# Patient Record
Sex: Female | Born: 1950 | Race: Black or African American | Hispanic: No | Marital: Married | Smoking: Never smoker
Health system: Southern US, Community
[De-identification: ages and names within clinical notes are randomized; demographics above are authoritative.]

## PROBLEM LIST (undated history)

## (undated) DIAGNOSIS — I499 Cardiac arrhythmia, unspecified: Secondary | ICD-10-CM

## (undated) DIAGNOSIS — Z8719 Personal history of other diseases of the digestive system: Secondary | ICD-10-CM

## (undated) DIAGNOSIS — N181 Chronic kidney disease, stage 1: Secondary | ICD-10-CM

## (undated) DIAGNOSIS — M5126 Other intervertebral disc displacement, lumbar region: Secondary | ICD-10-CM

## (undated) DIAGNOSIS — M51369 Other intervertebral disc degeneration, lumbar region without mention of lumbar back pain or lower extremity pain: Secondary | ICD-10-CM

## (undated) DIAGNOSIS — M5136 Other intervertebral disc degeneration, lumbar region: Secondary | ICD-10-CM

## (undated) DIAGNOSIS — G43909 Migraine, unspecified, not intractable, without status migrainosus: Secondary | ICD-10-CM

## (undated) DIAGNOSIS — M353 Polymyalgia rheumatica: Secondary | ICD-10-CM

## (undated) DIAGNOSIS — R112 Nausea with vomiting, unspecified: Secondary | ICD-10-CM

## (undated) DIAGNOSIS — F419 Anxiety disorder, unspecified: Secondary | ICD-10-CM

## (undated) DIAGNOSIS — D649 Anemia, unspecified: Secondary | ICD-10-CM

## (undated) DIAGNOSIS — B029 Zoster without complications: Secondary | ICD-10-CM

## (undated) DIAGNOSIS — M1990 Unspecified osteoarthritis, unspecified site: Secondary | ICD-10-CM

## (undated) DIAGNOSIS — J45909 Unspecified asthma, uncomplicated: Secondary | ICD-10-CM

## (undated) DIAGNOSIS — M179 Osteoarthritis of knee, unspecified: Secondary | ICD-10-CM

## (undated) DIAGNOSIS — K219 Gastro-esophageal reflux disease without esophagitis: Secondary | ICD-10-CM

## (undated) DIAGNOSIS — R011 Cardiac murmur, unspecified: Secondary | ICD-10-CM

## (undated) DIAGNOSIS — Z9889 Other specified postprocedural states: Secondary | ICD-10-CM

## (undated) DIAGNOSIS — E039 Hypothyroidism, unspecified: Secondary | ICD-10-CM

## (undated) DIAGNOSIS — H409 Unspecified glaucoma: Secondary | ICD-10-CM

## (undated) DIAGNOSIS — I10 Essential (primary) hypertension: Secondary | ICD-10-CM

## (undated) DIAGNOSIS — M1710 Unilateral primary osteoarthritis, unspecified knee: Secondary | ICD-10-CM

## (undated) HISTORY — PX: TOTAL ABDOMINAL HYSTERECTOMY: SHX209

## (undated) HISTORY — PX: APPENDECTOMY: SHX54

## (undated) HISTORY — PX: OTHER SURGICAL HISTORY: SHX169

## (undated) HISTORY — PX: TONSILLECTOMY: SUR1361

## (undated) HISTORY — PX: BACK SURGERY: SHX140

## (undated) HISTORY — PX: COLONOSCOPY: SHX174

## (undated) HISTORY — PX: THYROIDECTOMY, PARTIAL: SHX18

## (undated) HISTORY — PX: CHOLECYSTECTOMY: SHX55

## (undated) HISTORY — PX: JOINT REPLACEMENT: SHX530

## (undated) HISTORY — PX: TOTAL KNEE ARTHROPLASTY: SHX125

---

## 2001-06-05 ENCOUNTER — Encounter: Payer: Self-pay | Admitting: Internal Medicine

## 2001-06-05 ENCOUNTER — Encounter: Admission: RE | Admit: 2001-06-05 | Discharge: 2001-06-05 | Payer: Self-pay | Admitting: Internal Medicine

## 2001-06-07 ENCOUNTER — Encounter: Payer: Self-pay | Admitting: Internal Medicine

## 2001-06-07 ENCOUNTER — Encounter: Admission: RE | Admit: 2001-06-07 | Discharge: 2001-06-07 | Payer: Self-pay | Admitting: Internal Medicine

## 2001-06-15 ENCOUNTER — Encounter: Admission: RE | Admit: 2001-06-15 | Discharge: 2001-06-15 | Payer: Self-pay | Admitting: Internal Medicine

## 2001-06-15 ENCOUNTER — Encounter: Payer: Self-pay | Admitting: Internal Medicine

## 2001-09-21 ENCOUNTER — Other Ambulatory Visit: Admission: RE | Admit: 2001-09-21 | Discharge: 2001-09-21 | Payer: Self-pay | Admitting: Internal Medicine

## 2001-10-12 ENCOUNTER — Encounter: Payer: Self-pay | Admitting: Internal Medicine

## 2001-10-12 ENCOUNTER — Encounter: Admission: RE | Admit: 2001-10-12 | Discharge: 2001-10-12 | Payer: Self-pay | Admitting: Internal Medicine

## 2002-08-20 ENCOUNTER — Encounter: Payer: Self-pay | Admitting: Specialist

## 2002-08-25 ENCOUNTER — Ambulatory Visit (HOSPITAL_COMMUNITY): Admission: RE | Admit: 2002-08-25 | Discharge: 2002-08-25 | Payer: Self-pay | Admitting: Specialist

## 2003-07-29 ENCOUNTER — Encounter: Admission: RE | Admit: 2003-07-29 | Discharge: 2003-07-29 | Payer: Self-pay | Admitting: Internal Medicine

## 2004-05-30 ENCOUNTER — Encounter: Admission: RE | Admit: 2004-05-30 | Discharge: 2004-05-30 | Payer: Self-pay | Admitting: Internal Medicine

## 2004-11-14 ENCOUNTER — Emergency Department (HOSPITAL_COMMUNITY): Admission: EM | Admit: 2004-11-14 | Discharge: 2004-11-14 | Payer: Self-pay | Admitting: Emergency Medicine

## 2004-12-02 ENCOUNTER — Emergency Department (HOSPITAL_COMMUNITY): Admission: EM | Admit: 2004-12-02 | Discharge: 2004-12-02 | Payer: Self-pay | Admitting: Emergency Medicine

## 2005-04-19 ENCOUNTER — Ambulatory Visit (HOSPITAL_COMMUNITY): Admission: RE | Admit: 2005-04-19 | Discharge: 2005-04-20 | Payer: Self-pay | Admitting: Specialist

## 2006-09-19 ENCOUNTER — Encounter: Admission: RE | Admit: 2006-09-19 | Discharge: 2006-11-17 | Payer: Self-pay | Admitting: Orthopedic Surgery

## 2010-08-02 ENCOUNTER — Inpatient Hospital Stay (HOSPITAL_COMMUNITY): Admission: RE | Admit: 2010-08-02 | Discharge: 2010-08-06 | Payer: Self-pay | Admitting: Specialist

## 2010-11-27 LAB — BASIC METABOLIC PANEL
BUN: 10 mg/dL (ref 6–23)
BUN: 14 mg/dL (ref 6–23)
BUN: 8 mg/dL (ref 6–23)
BUN: 9 mg/dL (ref 6–23)
CO2: 29 mEq/L (ref 19–32)
CO2: 31 mEq/L (ref 19–32)
CO2: 31 mEq/L (ref 19–32)
Calcium: 8.1 mg/dL — ABNORMAL LOW (ref 8.4–10.5)
Calcium: 8.7 mg/dL (ref 8.4–10.5)
Calcium: 8.7 mg/dL (ref 8.4–10.5)
Chloride: 100 mEq/L (ref 96–112)
Chloride: 97 mEq/L (ref 96–112)
Creatinine, Ser: 1.13 mg/dL (ref 0.4–1.2)
GFR calc Af Amer: 59 mL/min — ABNORMAL LOW (ref >60)
GFR calc non Af Amer: 52 mL/min — ABNORMAL LOW (ref >60)
GFR calc non Af Amer: 53 mL/min — ABNORMAL LOW (ref >60)
Glucose, Bld: 103 mg/dL — ABNORMAL HIGH (ref 70–99)
Glucose, Bld: 120 mg/dL — ABNORMAL HIGH (ref 70–99)
Glucose, Bld: 125 mg/dL — ABNORMAL HIGH (ref 70–99)
Potassium: 3.5 mEq/L (ref 3.5–5.1)
Potassium: 4.3 mEq/L (ref 3.5–5.1)
Sodium: 138 mEq/L (ref 135–145)

## 2010-11-27 LAB — COMPREHENSIVE METABOLIC PANEL
Albumin: 3.6 g/dL (ref 3.5–5.2)
Alkaline Phosphatase: 100 U/L (ref 39–117)
BUN: 9 mg/dL (ref 6–23)
Chloride: 105 mEq/L (ref 96–112)
Glucose, Bld: 82 mg/dL (ref 70–99)
Potassium: 4.2 mEq/L (ref 3.5–5.1)
Total Bilirubin: 0.6 mg/dL (ref 0.3–1.2)

## 2010-11-27 LAB — DIFFERENTIAL
Basophils Absolute: 0 10*3/uL (ref 0.0–0.1)
Basophils Relative: 1 % (ref 0–1)
Eosinophils Absolute: 0.1 10*3/uL (ref 0.0–0.7)
Eosinophils Relative: 1 % (ref 0–5)
Lymphocytes Relative: 62 % — ABNORMAL HIGH (ref 12–46)
Lymphs Abs: 3.3 10*3/uL (ref 0.7–4.0)
Monocytes Absolute: 0.3 10*3/uL (ref 0.1–1.0)
Monocytes Relative: 6 % (ref 3–12)
Neutro Abs: 1.6 10*3/uL — ABNORMAL LOW (ref 1.7–7.7)
Neutrophils Relative %: 30 % — ABNORMAL LOW (ref 43–77)

## 2010-11-27 LAB — URINALYSIS, ROUTINE W REFLEX MICROSCOPIC
Bilirubin Urine: NEGATIVE
Glucose, UA: NEGATIVE mg/dL
Hgb urine dipstick: NEGATIVE
Ketones, ur: NEGATIVE mg/dL
Nitrite: NEGATIVE
pH: 7.5 (ref 5.0–8.0)

## 2010-11-27 LAB — CBC
HCT: 28.8 % — ABNORMAL LOW (ref 36.0–46.0)
HCT: 34.4 % — ABNORMAL LOW (ref 36.0–46.0)
Hemoglobin: 11.6 g/dL — ABNORMAL LOW (ref 12.0–15.0)
Hemoglobin: 8.2 g/dL — ABNORMAL LOW (ref 12.0–15.0)
MCH: 27.8 pg (ref 26.0–34.0)
MCH: 27.9 pg (ref 26.0–34.0)
MCH: 28.2 pg (ref 26.0–34.0)
MCH: 28.2 pg (ref 26.0–34.0)
MCHC: 33.5 g/dL (ref 30.0–36.0)
MCHC: 33.7 g/dL (ref 30.0–36.0)
MCHC: 33.9 g/dL (ref 30.0–36.0)
MCHC: 34 g/dL (ref 30.0–36.0)
MCV: 82 fL (ref 78.0–100.0)
MCV: 82.9 fL (ref 78.0–100.0)
MCV: 83.2 fL (ref 78.0–100.0)
MCV: 83.2 fL (ref 78.0–100.0)
Platelets: 196 10*3/uL (ref 150–400)
Platelets: 221 10*3/uL (ref 150–400)
RBC: 2.77 MIL/uL — ABNORMAL LOW (ref 3.87–5.11)
RBC: 4.19 MIL/uL (ref 3.87–5.11)
RDW: 14.2 % (ref 11.5–15.5)
RDW: 14.5 % (ref 11.5–15.5)
RDW: 14.8 % (ref 11.5–15.5)
WBC: 5.4 10*3/uL (ref 4.0–10.5)

## 2010-11-27 LAB — VITAMIN B12: Vitamin B-12: 155 pg/mL — ABNORMAL LOW (ref 211–911)

## 2010-11-27 LAB — RETICULOCYTES
RBC.: 3.2 MIL/uL — ABNORMAL LOW (ref 3.87–5.11)
Retic Count, Absolute: 28.8 10*3/uL (ref 19.0–186.0)
Retic Ct Pct: 0.9 % (ref 0.4–3.1)

## 2010-11-27 LAB — SURGICAL PCR SCREEN
MRSA, PCR: NEGATIVE
Staphylococcus aureus: NEGATIVE

## 2010-11-27 LAB — PROTIME-INR
INR: 0.98 (ref 0.00–1.49)
Prothrombin Time: 13.2 seconds (ref 11.6–15.2)

## 2010-11-27 LAB — FERRITIN: Ferritin: 247 ng/mL (ref 10–291)

## 2010-11-27 LAB — APTT: aPTT: 31 seconds (ref 24–37)

## 2010-11-27 LAB — IRON AND TIBC: Iron: <10 ug/dL — ABNORMAL LOW (ref 42–135)

## 2011-02-01 NOTE — Op Note (Signed)
NAMESHAWNICE, Jocelyn Wiley              ACCOUNT NO.:  [id number]   MEDICAL RECORD NO.:  [id number]          PATIENT TYPE:  OIB   LOCATION:  1503                         FACILITY:  [facility]   PHYSICIAN:  Jene Every, M.D.    DATE OF BIRTH:  March 17, 1951   DATE OF PROCEDURE:  04/19/2005  DATE OF DISCHARGE:                                 OPERATIVE REPORT   PREOPERATIVE DIAGNOSES:  1.  Spinal stenosis.  2.  Herniated nucleus pulposus L5-S1, left.   POSTOPERATIVE DIAGNOSES:  1.  Spinal stenosis.  2.  Herniated nucleus pulposus L5-S1, left.   PROCEDURES PERFORMED:  1.  Lateral recess decompression.  2.  Foraminotomy S1.  3.  Microdiskectomy, L5-S1.   ANESTHESIA:  General.   SURGEON:  Javier Docker, M.D.   ASSISTANT:  Roma Schanz, PA-C   BRIEF HISTORY AND INDICATIONS:  A 60 year old, S1 radiculopathy secondary to  focal disk herniation, lateral recess stenosis, disk generation L5-S1.  Operative intervention was indicated for decompression of the S1 nerve root  to relieve leg pain.  The risks and benefits discussed, including bleeding,  infection, damage to neurovascular structures, CSF leakage, epidural  fibrosis, adjacent segment disease, need for fusion in the future, and this  would not address her back pain, only leg pain.  She understood.   TECHNIQUE:  The patient in supine position.  After the induction of adequate  general anesthesia and 1 g of Kefzol, she was placed prone on the [place]  frame, all bony prominences well-padded.  The lumbar region was prepped and  draped in the usual sterile fashion.  Two 18 gauge spinal needles were  utilized to localize the 5-1 interspace, confirmed with an x-ray x 2.  An  incision was made from the spinous process of 5 to S1.  Subcutaneous tissue  was dissected.  Electrocautery was utilized to achieve hemostasis.  The  dorsolumbar fascia was identified and divided in line with the skin  incision.  Paraspinous muscle elevated from  the lamina of 5 and S1.  McCullough retractor was placed, first just cephalad to the L5-S1 space and  then at the space confirmed by x-ray.  First, hemilaminotomy in the caudad  edge was performed with a 2 and 3 mm Kerrison, detaching the ligamentum  flavum.  The ligamentum flavum detached from the cephalad edge of S1,  utilizing the straight curette and a hemilaminotomy.  Foraminotomy of S1 was  performed.  The nerve was found to be severely compressed into the lateral  recess.  Multifactorial facet hypertrophy, ligamentum flavum hypertrophy and  a focal disk herniation.  I decompressed the lateral recess utilizing a 2 mm  Kerrison to the medial border of the pedicle, removing the ligamentum flavum  and hypertrophy as well.  I gently mobilized the S2 nerve root medially;  focal HNP was noted.  I performed the annulotomy and removed disk material  from the subannular space.  Could not enter the disk space due to the  significant disk collapse.  There were osteophytes noted as well.  Felt that  decompressing the lateral recess and the ligamentum flavum  would allow the  S1 nerve root mobility away from the osteophytes.  Good excursion of the  root at least 1 cm medial to the pedicle after the decompression.  Disk  space copiously irrigated with antibiotic irrigation.  Inspection revealed  no evidence of CSF leakage or active bleeding.  Next, thrombin-soaked  Gelfoam was placed in the laminotomy defect.  The McCullough retractor was  removed, paraspinous muscles inspected, and there was no evidence of active  bleeding.  Dorsolumbar fascia reapproximated with #1 Vicryl interrupted  figure-of-eight sutures, the subcutaneous tissue reapproximated with 2-0  Vicryl simple sutures.  Skin reapproximated with 4 subcuticular Prolene.  Wound reinforced with Steri-Strips.  Sterile dressing applied.  Placed  supine on the hospital bed, extubated without difficulty, and transported to  the recovery room in  satisfactory condition.   The patient tolerated the procedure well with no complications.  Minimal  blood loss.       JB/MEDQ  D:  04/19/2005  T:  04/20/2005  Job:  [id number]

## 2011-02-01 NOTE — Op Note (Signed)
Jocelyn Wiley, KWASNIK                        ACCOUNT NO.:  [id number]   MEDICAL RECORD NO.:  [id number]                   PATIENT TYPE:  AMB   LOCATION:  DAY                                  FACILITY:  [facility]   PHYSICIAN:  Jene Every, M.D.                 DATE OF BIRTH:  [birth year]   DATE OF PROCEDURE:  08/25/2002  DATE OF DISCHARGE:                                 OPERATIVE REPORT   PREOPERATIVE DIAGNOSES:  1. Degenerative joint disease.  2. Medial and lateral meniscus tear of the right knee.   POSTOPERATIVE DIAGNOSES:  1. Lateral meniscus tear.  2. Minor medial meniscus tear.  3. Hypertrophic synovium.  4. Grade 3 chondromalacia of the patella.  5. Grade 4 chondromalacia of the lateral tibial plateau.   PROCEDURES:  1. Right knee arthroscopy.  2. Chondroplasty of the lateral tibial plateau, patella.  3. Shaving of medial and lateral meniscus and hypertrophic synovium.   BRIEF HISTORY AND INDICATIONS:  A 60 year old with knee pain and MRI  indicating possible meniscal tear, degenerative changes.  Intervention was  indicated for diagnosis and treatment.  The risks and benefits were  discussed, including bleeding, infection, damage to normal structures, no  change of symptoms, need for repeat debridement or total knee arthroplasty  in the future.   DESCRIPTION OF PROCEDURE:  The patient was placed in the supine position.  After the induction of adequate general anesthesia, 1 g Kefzol, the right  lower extremity was prepped and draped in the usual sterile fashion.  A  superior lateral parapatellar portal and a lateral parapatellar portal was  fashioned with a #11 blade, ingress cannula atraumatically.  Irrigant was  utilized to insufflate the joint.  The camera was then inserted via the  lateral portal.  Inspection of the suprapatellar pouch revealed a 1.5 x 1.5  sq. cm region of grade 3 chondromalacia.  There were loose bodies noted in  the joint.  They were evacuated.   Normal patellofemoral tracking.  Under  direct visualization, a medial peripatellar portal was fashioned with a #11  blade after localization with an 18-gague needle.  This spared the medial  meniscus.  Some hypertrophic synovium in the intercondylar notch.  In the  medial compartment there was some slight fraying of the medial meniscus.  It  was stable to probe palpation.  There were some minor grade 2 changes of the  femoral condyle and the tibial plateau.  Laterally there was a grade 4  lesion near the intercondylar notch anteriorly and medially.  There were  some grade 3 changes of the weightbearing surface and a tear of the  posterior horn of the lateral meniscus.  I introduced a shaver and shaved  the meniscus to a stable base and performed a chondroplasty with the radius  resector.  The remainder of the meniscus was stable to probe palpation.  Examined the gutters, and  they were unremarkable.  I copiously lavaged the  knee and reinspected each compartment without residual pathology amenable to  further arthroscopic procedure.   Next the knee was copiously lavaged.  The portals were then closed with 4-0  nylon simple suture and 0.25% Marcaine with epinephrine was infiltrated in  the joint.  The wound was dressed sterilely.  The patient was awoken without  difficulty and transported to the recovery room in satisfactory condition.   The patient tolerated the procedure well with no complications.                                                Jene Every, M.D.    Cordelia Pen  D:  08/25/2002  T:  08/25/2002  Job:  [id number]

## 2016-09-16 DIAGNOSIS — B029 Zoster without complications: Secondary | ICD-10-CM

## 2016-09-16 HISTORY — DX: Zoster without complications: B02.9

## 2018-04-15 ENCOUNTER — Ambulatory Visit: Payer: Self-pay | Admitting: Orthopedic Surgery

## 2018-05-11 ENCOUNTER — Encounter (HOSPITAL_COMMUNITY): Payer: Self-pay

## 2018-05-11 NOTE — Patient Instructions (Signed)
Your procedure is scheduled on: Sept. 4, 2019   Surgery Time:  10:45AM-12:45PM   [facility] Main  Entr[facility]    Report to admitting at  8:15 AM   Call this number if you have problems the morning of surgery [phone]   Do not eat food or drink liquids :After Midnight.   Do NOT smoke after Midnight   Take these medicines the morning of surgery with A SIP OF WATER: Levothyroxine, Metoprolol   Use eye drops per normal routine                               You may not have any metal on your body including hair pins, jewelry, and body piercings             Do not wear make-up, lotions, powders, perfumes/cologne, or deodorant             Do not wear nail polish.  Do not shave  48 hours prior to surgery.                Do not bring valuables to the hospital. [facility] IS NOT             RESPONSIBLE   FOR VALUABLES.   Contacts, dentures or bridgework may not be worn into surgery.   Leave suitcase in the car. After surgery it may be brought to your room.   Special Instructions: Bring a copy of your healthcare power of attorney and living will documents         the day of surgery if you haven't scanned them in before.              Please read over the following fact sheets you were given:  [facility] - Preparing for Surgery Before surgery, you can play an important role.  Because skin is not sterile, your skin needs to be as free of germs as possible.  You can reduce the number of germs on your skin by washing with CHG (chlorahexidine gluconate) soap before surgery.  CHG is an antiseptic cleaner which kills germs and bonds with the skin to continue killing germs even after washing. Please DO NOT use if you have an allergy to CHG or antibacterial soaps.  If your skin becomes reddened/irritated stop using the CHG and inform your nurse when you arrive at Short Stay. Do not shave (including legs and underarms) for at least 48 hours prior to the first CHG shower.   You may shave your face/neck.  Please follow these instructions carefully:  1.  Shower with CHG Soap the night before surgery and the  morning of surgery.  2.  If you choose to wash your hair, wash your hair first as usual with your normal  shampoo.  3.  After you shampoo, rinse your hair and body thoroughly to remove the shampoo.                             4.  Use CHG as you would any other liquid soap.  You can apply chg directly to the skin and wash.  Gently with a scrungie or clean washcloth.  5.  Apply the CHG Soap to your body ONLY FROM THE NECK DOWN.   Do   not use on face/ open  Wound or open sores. Avoid contact with eyes, ears mouth and   genitals (private parts).                       Wash face,  Genitals (private parts) with your normal soap.             6.  Wash thoroughly, paying special attention to the area where your    surgery  will be performed.  7.  Thoroughly rinse your body with warm water from the neck down.  8.  DO NOT shower/wash with your normal soap after using and rinsing off the CHG Soap.                9.  Pat yourself dry with a clean towel.            10.  Wear clean pajamas.            11.  Place clean sheets on your bed the night of your first shower and do not  sleep with pets. Day of Surgery : Do not apply any lotions/deodorants the morning of surgery.  Please wear clean clothes to the hospital/surgery center.  FAILURE TO FOLLOW THESE INSTRUCTIONS MAY RESULT IN THE CANCELLATION OF YOUR SURGERY  PATIENT SIGNATURE_________________________________  NURSE SIGNATURE__________________________________  ________________________________________________________________________   Adam Phenix  An incentive spirometer is a tool that can help keep your lungs clear and active. This tool measures how well you are filling your lungs with each breath. Taking long deep breaths may help reverse or decrease the chance of developing  breathing (pulmonary) problems (especially infection) following:  A long period of time when you are unable to move or be active. BEFORE THE PROCEDURE   If the spirometer includes an indicator to show your best effort, your nurse or respiratory therapist will set it to a desired goal.  If possible, sit up straight or lean slightly forward. Try not to slouch.  Hold the incentive spirometer in an upright position. INSTRUCTIONS FOR USE  1. Sit on the edge of your bed if possible, or sit up as far as you can in bed or on a chair. 2. Hold the incentive spirometer in an upright position. 3. Breathe out normally. 4. Place the mouthpiece in your mouth and seal your lips tightly around it. 5. Breathe in slowly and as deeply as possible, raising the piston or the ball toward the top of the column. 6. Hold your breath for 3-5 seconds or for as long as possible. Allow the piston or ball to fall to the bottom of the column. 7. Remove the mouthpiece from your mouth and breathe out normally. 8. Rest for a few seconds and repeat Steps 1 through 7 at least 10 times every 1-2 hours when you are awake. Take your time and take a few normal breaths between deep breaths. 9. The spirometer may include an indicator to show your best effort. Use the indicator as a goal to work toward during each repetition. 10. After each set of 10 deep breaths, practice coughing to be sure your lungs are clear. If you have an incision (the cut made at the time of surgery), support your incision when coughing by placing a pillow or rolled up towels firmly against it. Once you are able to get out of bed, walk around indoors and cough well. You may stop using the incentive spirometer when instructed by your caregiver.  RISKS AND COMPLICATIONS  Take your time  so you do not get dizzy or light-headed.  If you are in pain, you may need to take or ask for pain medication before doing incentive spirometry. It is harder to take a deep  breath if you are having pain. AFTER USE  Rest and breathe slowly and easily.  It can be helpful to keep track of a log of your progress. Your caregiver can provide you with a simple table to help with this. If you are using the spirometer at home, follow these instructions: [place] IF:   You are having difficultly using the spirometer.  You have trouble using the spirometer as often as instructed.  Your pain medication is not giving enough relief while using the spirometer.  You develop fever of 100.5 F (38.1 C) or higher. SEEK IMMEDIATE MEDICAL CARE IF:   You cough up bloody sputum that had not been present before.  You develop fever of 102 F (38.9 C) or greater.  You develop worsening pain at or near the incision site. MAKE SURE YOU:   Understand these instructions.  Will watch your condition.  Will get help right away if you are not doing well or get worse. Document Released: 01/13/2007 Document Revised: 11/25/2011 Document Reviewed: 03/16/2007 [facility] Patient Information 2014 [place], [place].   ________________________________________________________________________

## 2018-05-12 ENCOUNTER — Encounter (HOSPITAL_COMMUNITY): Payer: Self-pay

## 2018-05-12 ENCOUNTER — Ambulatory Visit (HOSPITAL_COMMUNITY)
Admission: RE | Admit: 2018-05-12 | Discharge: 2018-05-12 | Disposition: A | Discharge status: Home / Self Care | Payer: Medicare Other | Source: Ambulatory Visit | Attending: Anesthesiology | Admitting: Anesthesiology

## 2018-05-12 ENCOUNTER — Other Ambulatory Visit: Payer: Self-pay

## 2018-05-12 ENCOUNTER — Encounter (HOSPITAL_COMMUNITY)
Admission: RE | Admit: 2018-05-12 | Discharge: 2018-05-12 | Disposition: A | Discharge status: Home / Self Care | Payer: Medicare Other | Source: Ambulatory Visit | Attending: Specialist | Admitting: Specialist

## 2018-05-12 DIAGNOSIS — Z01810 Encounter for preprocedural cardiovascular examination: Secondary | ICD-10-CM | POA: Diagnosis not present

## 2018-05-12 DIAGNOSIS — Z01818 Encounter for other preprocedural examination: Secondary | ICD-10-CM | POA: Diagnosis present

## 2018-05-12 DIAGNOSIS — Z01812 Encounter for preprocedural laboratory examination: Secondary | ICD-10-CM | POA: Diagnosis not present

## 2018-05-12 DIAGNOSIS — M1712 Unilateral primary osteoarthritis, left knee: Secondary | ICD-10-CM | POA: Diagnosis not present

## 2018-05-12 HISTORY — DX: Unspecified osteoarthritis, unspecified site: M19.90

## 2018-05-12 HISTORY — DX: Unilateral primary osteoarthritis, unspecified knee: M17.10

## 2018-05-12 HISTORY — DX: Hypothyroidism, unspecified: E03.9

## 2018-05-12 HISTORY — DX: Other intervertebral disc degeneration, lumbar region without mention of lumbar back pain or lower extremity pain: M51.369

## 2018-05-12 HISTORY — DX: Essential (primary) hypertension: I10

## 2018-05-12 HISTORY — DX: Other specified postprocedural states: R11.2

## 2018-05-12 HISTORY — DX: Other intervertebral disc degeneration, lumbar region: M51.36

## 2018-05-12 HISTORY — DX: Other specified postprocedural states: Z98.890

## 2018-05-12 HISTORY — DX: Osteoarthritis of knee, unspecified: M17.9

## 2018-05-12 HISTORY — DX: Other intervertebral disc displacement, lumbar region: M51.26

## 2018-05-12 HISTORY — DX: Migraine, unspecified, not intractable, without status migrainosus: G43.909

## 2018-05-12 HISTORY — DX: Polymyalgia rheumatica: M35.3

## 2018-05-12 HISTORY — DX: Chronic kidney disease, stage 1: N18.1

## 2018-05-12 HISTORY — DX: Unspecified glaucoma: H40.9

## 2018-05-12 HISTORY — DX: Anemia, unspecified: D64.9

## 2018-05-12 HISTORY — DX: Cardiac murmur, unspecified: R01.1

## 2018-05-12 HISTORY — DX: Unspecified asthma, uncomplicated: J45.909

## 2018-05-12 HISTORY — DX: Anxiety disorder, unspecified: F41.9

## 2018-05-12 LAB — URINALYSIS, ROUTINE W REFLEX MICROSCOPIC
Bilirubin Urine: NEGATIVE
Glucose, UA: NEGATIVE mg/dL
Hgb urine dipstick: NEGATIVE
Ketones, ur: NEGATIVE mg/dL
LEUKOCYTES UA: NEGATIVE
Nitrite: NEGATIVE
Protein, ur: NEGATIVE mg/dL
SPECIFIC GRAVITY, URINE: 1.004 — AB (ref 1.005–1.030)
pH: 8 (ref 5.0–8.0)

## 2018-05-12 LAB — BASIC METABOLIC PANEL
ANION GAP: 10 (ref 5–15)
BUN: 18 mg/dL (ref 8–23)
CHLORIDE: 99 mmol/L (ref 98–111)
CO2: 29 mmol/L (ref 22–32)
Calcium: 9.9 mg/dL (ref 8.9–10.3)
Creatinine, Ser: 1.22 mg/dL — ABNORMAL HIGH (ref 0.44–1.00)
GFR calc Af Amer: 52 mL/min — ABNORMAL LOW (ref >60)
GFR calc non Af Amer: 44 mL/min — ABNORMAL LOW (ref >60)
GLUCOSE: 82 mg/dL (ref 70–99)
POTASSIUM: 4.4 mmol/L (ref 3.5–5.1)
SODIUM: 138 mmol/L (ref 135–145)

## 2018-05-12 LAB — APTT: aPTT: 28 seconds (ref 24–36)

## 2018-05-12 LAB — CBC
HEMATOCRIT: 37.4 % (ref 36.0–46.0)
HEMOGLOBIN: 12.2 g/dL (ref 12.0–15.0)
MCH: 27.7 pg (ref 26.0–34.0)
MCHC: 32.6 g/dL (ref 30.0–36.0)
MCV: 84.8 fL (ref 78.0–100.0)
Platelets: 266 10*3/uL (ref 150–400)
RBC: 4.41 MIL/uL (ref 3.87–5.11)
RDW: 13.6 % (ref 11.5–15.5)
WBC: 5.2 10*3/uL (ref 4.0–10.5)

## 2018-05-12 LAB — PROTIME-INR
INR: 0.91
Prothrombin Time: 12.1 seconds (ref 11.4–15.2)

## 2018-05-12 LAB — SURGICAL PCR SCREEN
MRSA, PCR: NEGATIVE
STAPHYLOCOCCUS AUREUS: NEGATIVE

## 2018-05-12 NOTE — Pre-Procedure Instructions (Signed)
BMP, UA results 05/12/2018 faxed to Dr. Shelle IronBeane via epic.

## 2018-05-15 ENCOUNTER — Ambulatory Visit: Payer: Self-pay | Admitting: Orthopedic Surgery

## 2018-05-15 ENCOUNTER — Other Ambulatory Visit: Payer: Self-pay | Admitting: Specialist

## 2018-05-15 NOTE — Care Plan (Signed)
L TKA scheduled on 05-20-18 DCP:  Home with spouse.  1 story/3-4 ste. DME:  No needs.  Has a RW, cane, 3-in-1, elevated toilets with grab bars. PT:  [facility]. PT eval scheduled on 05-28-18

## 2018-05-15 NOTE — H&P (Signed)
Jocelyn Wiley is an 67 y.o. female.   Chief Complaint: left knee pain HPI: The patient is here for her H & P for a left total knee replacement by Dr. Shelle Iron scheduled on 05/20/18 at [facility]. The patient states that she feels like she has shingles in the right lower leg. She has a history of neuropathy or shingles type pain that occurs in the same area in the right anterolateral lower leg with times of stress and she feels that it is related to her recent cardiology workup and upcoming surgery. She typically takes gabapentin and tramadol for that, she is not sure if her PCP has called those in for her yet. She reports chronic progressively worsening left knee pain refractory to injection therapy, unable to tolerate prednisone so has had Visco supplementation without relief, bracing, quad strengthening, activity modifications, relative rest, pain medications. Pain is interfering with ADLs and quality-of-life at this point as she desires to proceed with surgery. She underwent total knee replacement on the right in 2011 by Dr. Shelle Iron and did well postoperatively.  Dr. Shelle Iron and the patient mutually agreed to proceed with a total knee replacement. Risks and benefits of the procedure were discussed including stiffness, suboptimal range of motion, persistent pain, infection requiring removal of prosthesis and reinsertion, need for prophylactic antibiotics in the future, for example, dental procedures, possible need for manipulation, revision in the future and also anesthetic complications including DVT, PE, etc. We discussed the perioperative course, time in the hospital, postoperative recovery and the need for elevation to control swelling. We also discussed the predicted range of motion and the probability that squatting and kneeling would be unobtainable in the future. In addition, postoperative anticoagulation was discussed. We have obtained preoperative medical clearance as necessary. Provided  illustrated handout and discussed it in detail. They will enroll in the total joint replacement educational forum at the hospital.  Past Medical History:  Diagnosis Date  . Anemia   . Anxiety   . Asthma    seasonal  . CKD (chronic kidney disease), stage I   . DDD (degenerative disc disease), lumbar   . 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 Glaucoma   . Heart murmur   . Hypertension   . Hypothyroidism   . Lumbar disc herniation   . Migraines   . OA (osteoarthritis)   . Polymyalgia rheumatica (HCC)   . PONV (postoperative nausea and vomiting)     Past Surgical History:  Procedure Laterality Date  . APPENDECTOMY    . BACK SURGERY    . CHOLECYSTECTOMY    . COLONOSCOPY    . THYROIDECTOMY, PARTIAL    . TONSILLECTOMY    . TOTAL ABDOMINAL HYSTERECTOMY    . TOTAL KNEE ARTHROPLASTY Right     No family history on file. Social History:  reports that she has never smoked. She has never used smokeless tobacco. She reports that she drinks alcohol. She reports that she does not use drugs.  Allergies:  Allergies  Allergen Reactions  . Hydrocodone Nausea And Vomiting  . Caffeine Palpitations   Meds: fluticasone propionate 50 mcg/actuation nasal spray,suspension levothyroxine 75 mcg tablet loratadine 10 mg tablet metoprolol succinate ER 25 mg tablet,extended release 24 hr Premarin 0.9 mg tablet ProAir HFA 90 mcg/actuation aerosol inhaler traMADol 50 mg tablet traZODone valsartan 320 mg tablet  Review of Systems  Constitutional: Negative.   HENT: Negative.   Eyes: Negative.   Respiratory: Negative.   Cardiovascular:  Negative.   Gastrointestinal: Negative.   Genitourinary: Negative.   Musculoskeletal: Positive for joint pain.  Skin: Negative.   Neurological: Negative.   Psychiatric/Behavioral: Negative.     There were no vitals taken for this visit. Physical Exam  Constitutional: She is oriented to person, place, and time. She appears well-developed  and well-nourished.  HENT:  Head: Normocephalic.  Eyes: Pupils are equal, round, and reactive to light.  Neck: Normal range of motion.  Cardiovascular: Normal rate.  Respiratory: Effort normal.  GI: Soft.  Musculoskeletal:  Patient is a 67 year old female.  Constitutional: General Appearance: healthy-appearing and NAD.  Gait and Station: Appearance: antalgic gait.  Cardiovascular System: Arterial Pulses Left: femoral normal, popliteal normal, dorsalis pedis normal, and posterior tibialis normal. Edema Left: no edema. Varicosities Left: no varicosities.  Lymph Nodes: Inspection/Palpation Left: no inguinal LAD.  Knees: Inspection Left: no deformity and swelling. Bony Palpation Left: no tenderness of the superior pole patella, the inferior pole patella, the tibial tubercle, the medial joint line, the lateral joint line, Gerdy's tubercle, or the neck of fibula and tenderness of the medial tibial plateau. Soft Tissue Palpation Left: no tenderness of the quadriceps tendon, the prepatellar bursa, the patellar tendon, the medial collateral ligament, or the infrapatellar tendon. Active Range of Motion Left: limited. Passive Range of Motion Left: limited. Stability Left: no laxity or ligamentous instability and anterior drawer sign negative and Lachman test negative. Special Tests Left: McMurray's test negative. Strength Left: no hamstring weakness or quadriceps weakness and flexion 5/5 and extension 5/5.  Skin: Left Lower Extremity: normal.  Neurologic: Ankle Reflex Left: normal (2). Knee Reflex Left: normal (2). Sensation on the Left: L2 normal, L3 normal, L4 normal, L5 normal, and S1 normal.  Psychiatric: Mood and Affect: active and alert and normal mood.  Neurological: She is alert and oriented to person, place, and time.    X-rays with end-stage degenerative changes, bone-on-bone medial joint space with Varus deformity.  Assessment/Plan Impression: End-stage left knee  osteoarthritis  Plan: Pt with end-stage Left knee DJD, bone-on-bone, refractory to conservative tx, scheduled for Left total knee replacement by Dr. Shelle IronBeane on September 4. We again discussed the procedure itself as well as risks, complications and alternatives, including but not limited to DVT, PE, infx, bleeding, failure of procedure, need for secondary procedure including manipulation, nerve injury, ongoing pain/symptoms, anesthesia risk, even stroke or death. Also discussed typical post-op protocols, activity restrictions, need for PT, flexion/extension exercises, time out of work. Discussed need for DVT ppx post-op per protocol. Discussed dental ppx and infx prevention. Also discussed limitations post-operatively such as kneeling and squatting. All questions were answered. Patient desires to proceed with surgery as scheduled.  Will hold supplements, ASA and NSAIDs accordingly. Will remain NPO after midnight the night before surgery. [facility]. Anticipate hospital stay to include at least 2 midnights given medical history and to ensure proper pain control. Plan ASA 325mg  BID for DVT ppx post-op. Plan Tramadol, Colace, Miralax. Plan discharge to home post-op with family members at home for assistance, to start outpt PT Monday 9/9. Will follow up 10-14 days post-op for suture removal and xrays.  Anticipated LOS equal to or greater than 2 midnights due to - Age 68 and older with one or more of the following:  - Obesity  - Expected need for hospital services (PT, OT, Nursing) required for safe  discharge  - Anticipated need for postoperative skilled nursing care or inpatient rehab  Plan left total  knee replacement  Dorothy Spark., PA-C for Dr. Shelle Iron 05/15/2018, 1:06 PM

## 2018-05-15 NOTE — H&P (View-Only) (Signed)
Jocelyn Wiley is an 67 y.o. female.   Chief Complaint: left knee pain HPI: The patient is here for her H & P for a left total knee replacement by Dr. Beane scheduled on 05/20/18 at [facility]. The patient states that she feels like she has shingles in the right lower leg. She has a history of neuropathy or shingles type pain that occurs in the same area in the right anterolateral lower leg with times of stress and she feels that it is related to her recent cardiology workup and upcoming surgery. She typically takes gabapentin and tramadol for that, she is not sure if her PCP has called those in for her yet. She reports chronic progressively worsening left knee pain refractory to injection therapy, unable to tolerate prednisone so has had Visco supplementation without relief, bracing, quad strengthening, activity modifications, relative rest, pain medications. Pain is interfering with ADLs and quality-of-life at this point as she desires to proceed with surgery. She underwent total knee replacement on the right in 2011 by Dr. Beane and did well postoperatively.  Dr. Beane and the patient mutually agreed to proceed with a total knee replacement. Risks and benefits of the procedure were discussed including stiffness, suboptimal range of motion, persistent pain, infection requiring removal of prosthesis and reinsertion, need for prophylactic antibiotics in the future, for example, dental procedures, possible need for manipulation, revision in the future and also anesthetic complications including DVT, PE, etc. We discussed the perioperative course, time in the hospital, postoperative recovery and the need for elevation to control swelling. We also discussed the predicted range of motion and the probability that squatting and kneeling would be unobtainable in the future. In addition, postoperative anticoagulation was discussed. We have obtained preoperative medical clearance as necessary. Provided  illustrated handout and discussed it in detail. They will enroll in the total joint replacement educational forum at the hospital.  Past Medical History:  Diagnosis Date  . Anemia   . Anxiety   . Asthma    seasonal  . CKD (chronic kidney disease), stage I   . DDD (degenerative disc disease), lumbar   . 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 Glaucoma   . Heart murmur   . Hypertension   . Hypothyroidism   . Lumbar disc herniation   . Migraines   . OA (osteoarthritis)   . Polymyalgia rheumatica (HCC)   . PONV (postoperative nausea and vomiting)     Past Surgical History:  Procedure Laterality Date  . APPENDECTOMY    . BACK SURGERY    . CHOLECYSTECTOMY    . COLONOSCOPY    . THYROIDECTOMY, PARTIAL    . TONSILLECTOMY    . TOTAL ABDOMINAL HYSTERECTOMY    . TOTAL KNEE ARTHROPLASTY Right     No family history on file. Social History:  reports that she has never smoked. She has never used smokeless tobacco. She reports that she drinks alcohol. She reports that she does not use drugs.  Allergies:  Allergies  Allergen Reactions  . Hydrocodone Nausea And Vomiting  . Caffeine Palpitations   Meds: fluticasone propionate 50 mcg/actuation nasal spray,suspension levothyroxine 75 mcg tablet loratadine 10 mg tablet metoprolol succinate ER 25 mg tablet,extended release 24 hr Premarin 0.9 mg tablet ProAir HFA 90 mcg/actuation aerosol inhaler traMADol 50 mg tablet traZODone valsartan 320 mg tablet  Review of Systems  Constitutional: Negative.   HENT: Negative.   Eyes: Negative.   Respiratory: Negative.   Cardiovascular:   Negative.   Gastrointestinal: Negative.   Genitourinary: Negative.   Musculoskeletal: Positive for joint pain.  Skin: Negative.   Neurological: Negative.   Psychiatric/Behavioral: Negative.     There were no vitals taken for this visit. Physical Exam  Constitutional: She is oriented to person, place, and time. She appears well-developed  and well-nourished.  HENT:  Head: Normocephalic.  Eyes: Pupils are equal, round, and reactive to light.  Neck: Normal range of motion.  Cardiovascular: Normal rate.  Respiratory: Effort normal.  GI: Soft.  Musculoskeletal:  Patient is a 67-year-old female.  Constitutional: General Appearance: healthy-appearing and NAD.  Gait and Station: Appearance: antalgic gait.  Cardiovascular System: Arterial Pulses Left: femoral normal, popliteal normal, dorsalis pedis normal, and posterior tibialis normal. Edema Left: no edema. Varicosities Left: no varicosities.  Lymph Nodes: Inspection/Palpation Left: no inguinal LAD.  Knees: Inspection Left: no deformity and swelling. Bony Palpation Left: no tenderness of the superior pole patella, the inferior pole patella, the tibial tubercle, the medial joint line, the lateral joint line, Gerdy's tubercle, or the neck of fibula and tenderness of the medial tibial plateau. Soft Tissue Palpation Left: no tenderness of the quadriceps tendon, the prepatellar bursa, the patellar tendon, the medial collateral ligament, or the infrapatellar tendon. Active Range of Motion Left: limited. Passive Range of Motion Left: limited. Stability Left: no laxity or ligamentous instability and anterior drawer sign negative and Lachman test negative. Special Tests Left: McMurray's test negative. Strength Left: no hamstring weakness or quadriceps weakness and flexion 5/5 and extension 5/5.  Skin: Left Lower Extremity: normal.  Neurologic: Ankle Reflex Left: normal (2). Knee Reflex Left: normal (2). Sensation on the Left: L2 normal, L3 normal, L4 normal, L5 normal, and S1 normal.  Psychiatric: Mood and Affect: active and alert and normal mood.  Neurological: She is alert and oriented to person, place, and time.    X-rays with end-stage degenerative changes, bone-on-bone medial joint space with Varus deformity.  Assessment/Plan Impression: End-stage left knee  osteoarthritis  Plan: Pt with end-stage Left knee DJD, bone-on-bone, refractory to conservative tx, scheduled for Left total knee replacement by Dr. Beane on September 4. We again discussed the procedure itself as well as risks, complications and alternatives, including but not limited to DVT, PE, infx, bleeding, failure of procedure, need for secondary procedure including manipulation, nerve injury, ongoing pain/symptoms, anesthesia risk, even stroke or death. Also discussed typical post-op protocols, activity restrictions, need for PT, flexion/extension exercises, time out of work. Discussed need for DVT ppx post-op per protocol. Discussed dental ppx and infx prevention. Also discussed limitations post-operatively such as kneeling and squatting. All questions were answered. Patient desires to proceed with surgery as scheduled.  Will hold supplements, ASA and NSAIDs accordingly. Will remain NPO after midnight the night before surgery. Will present to [facility] for pre-op testing. Anticipate hospital stay to include at least 2 midnights given medical history and to ensure proper pain control. Plan ASA 325mg BID for DVT ppx post-op. Plan Tramadol, Colace, Miralax. Plan discharge to home post-op with family members at home for assistance, to start outpt PT Monday 9/9. Will follow up 10-14 days post-op for suture removal and xrays.  Anticipated LOS equal to or greater than 2 midnights due to - Age 65 and older with one or more of the following:  - Obesity  - Expected need for hospital services (PT, OT, Nursing) required for safe  discharge  - Anticipated need for postoperative skilled nursing care or inpatient rehab  Plan left total   knee replacement  BISSELL, JACLYN M., PA-C for Dr. Beane 05/15/2018, 1:06 PM   

## 2018-05-20 ENCOUNTER — Encounter (HOSPITAL_COMMUNITY): Payer: Self-pay | Admitting: Certified Registered Nurse Anesthetist

## 2018-05-20 ENCOUNTER — Inpatient Hospital Stay (HOSPITAL_COMMUNITY): Payer: Medicare Other | Admitting: Certified Registered Nurse Anesthetist

## 2018-05-20 ENCOUNTER — Encounter (HOSPITAL_COMMUNITY)
Admission: RE | Disposition: A | Discharge status: Home / Self Care | Payer: Self-pay | Source: Ambulatory Visit | Attending: Specialist

## 2018-05-20 ENCOUNTER — Other Ambulatory Visit: Payer: Self-pay

## 2018-05-20 ENCOUNTER — Inpatient Hospital Stay (HOSPITAL_COMMUNITY)
Admission: RE | Admit: 2018-05-20 | Discharge: 2018-05-23 | DRG: 470 | Disposition: A | Discharge status: Home / Self Care | Payer: Medicare Other | Source: Ambulatory Visit | Attending: Specialist | Admitting: Specialist

## 2018-05-20 ENCOUNTER — Inpatient Hospital Stay (HOSPITAL_COMMUNITY): Payer: Medicare Other

## 2018-05-20 DIAGNOSIS — Z79890 Hormone replacement therapy (postmenopausal): Secondary | ICD-10-CM

## 2018-05-20 DIAGNOSIS — I129 Hypertensive chronic kidney disease with stage 1 through stage 4 chronic kidney disease, or unspecified chronic kidney disease: Secondary | ICD-10-CM | POA: Diagnosis present

## 2018-05-20 DIAGNOSIS — E669 Obesity, unspecified: Secondary | ICD-10-CM | POA: Diagnosis present

## 2018-05-20 DIAGNOSIS — M353 Polymyalgia rheumatica: Secondary | ICD-10-CM | POA: Diagnosis present

## 2018-05-20 DIAGNOSIS — Z885 Allergy status to narcotic agent status: Secondary | ICD-10-CM | POA: Diagnosis not present

## 2018-05-20 DIAGNOSIS — H409 Unspecified glaucoma: Secondary | ICD-10-CM | POA: Diagnosis present

## 2018-05-20 DIAGNOSIS — J45909 Unspecified asthma, uncomplicated: Secondary | ICD-10-CM | POA: Diagnosis present

## 2018-05-20 DIAGNOSIS — Z96659 Presence of unspecified artificial knee joint: Secondary | ICD-10-CM

## 2018-05-20 DIAGNOSIS — Z6829 Body mass index (BMI) 29.0-29.9, adult: Secondary | ICD-10-CM | POA: Diagnosis not present

## 2018-05-20 DIAGNOSIS — Z79899 Other long term (current) drug therapy: Secondary | ICD-10-CM | POA: Diagnosis not present

## 2018-05-20 DIAGNOSIS — M1712 Unilateral primary osteoarthritis, left knee: Secondary | ICD-10-CM | POA: Diagnosis present

## 2018-05-20 DIAGNOSIS — N181 Chronic kidney disease, stage 1: Secondary | ICD-10-CM | POA: Diagnosis present

## 2018-05-20 DIAGNOSIS — E039 Hypothyroidism, unspecified: Secondary | ICD-10-CM | POA: Diagnosis present

## 2018-05-20 HISTORY — DX: Zoster without complications: B02.9

## 2018-05-20 HISTORY — PX: TOTAL KNEE ARTHROPLASTY: SHX125

## 2018-05-20 SURGERY — ARTHROPLASTY, KNEE, TOTAL
Anesthesia: General | Site: Knee | Laterality: Left

## 2018-05-20 MED ORDER — ESTROGENS CONJUGATED 0.45 MG PO TABS
0.9000 mg | ORAL_TABLET | Freq: Every day | ORAL | Status: DC
  Administered 2018-05-21 – 2018-05-23 (×3): 0.9 mg via ORAL
  Filled 2018-05-20 (×3): qty 2

## 2018-05-20 MED ORDER — TRAMADOL HCL 50 MG PO TABS
50.0000 mg | ORAL_TABLET | Freq: Two times a day (BID) | ORAL | Status: DC | PRN
  Administered 2018-05-21 – 2018-05-23 (×3): 50 mg via ORAL
  Filled 2018-05-20 (×3): qty 1

## 2018-05-20 MED ORDER — CEFAZOLIN SODIUM-DEXTROSE 2-4 GM/100ML-% IV SOLN
2.0000 g | Freq: Four times a day (QID) | INTRAVENOUS | Status: AC
  Administered 2018-05-20 – 2018-05-21 (×3): 2 g via INTRAVENOUS
  Filled 2018-05-20 (×3): qty 100

## 2018-05-20 MED ORDER — BISACODYL 5 MG PO TBEC: 5.0000 mg | DELAYED_RELEASE_TABLET | Freq: Every day | ORAL | Status: DC | PRN

## 2018-05-20 MED ORDER — CEFAZOLIN SODIUM-DEXTROSE 2-4 GM/100ML-% IV SOLN
2.0000 g | INTRAVENOUS | Status: AC
  Administered 2018-05-20: 2 g via INTRAVENOUS
  Filled 2018-05-20: qty 100

## 2018-05-20 MED ORDER — MENTHOL 3 MG MT LOZG: 1.0000 | LOZENGE | OROMUCOSAL | Status: DC | PRN

## 2018-05-20 MED ORDER — IPRATROPIUM BROMIDE 0.06 % NA SOLN
2.0000 | Freq: Four times a day (QID) | NASAL | Status: DC | PRN
  Filled 2018-05-20: qty 15

## 2018-05-20 MED ORDER — BUPIVACAINE-EPINEPHRINE (PF) 0.5% -1:200000 IJ SOLN
INTRAMUSCULAR | Status: AC
  Filled 2018-05-20: qty 30

## 2018-05-20 MED ORDER — MIDAZOLAM HCL 2 MG/2ML IJ SOLN: 2.0000 mg | Freq: Once | INTRAMUSCULAR | Status: DC

## 2018-05-20 MED ORDER — SODIUM CHLORIDE 0.9 % IV SOLN
INTRAVENOUS | Status: AC
  Filled 2018-05-20: qty 500000

## 2018-05-20 MED ORDER — PHENYLEPHRINE 40 MCG/ML (10ML) SYRINGE FOR IV PUSH (FOR BLOOD PRESSURE SUPPORT)
PREFILLED_SYRINGE | INTRAVENOUS | Status: DC | PRN
  Administered 2018-05-20 (×2): 60 ug via INTRAVENOUS

## 2018-05-20 MED ORDER — METOCLOPRAMIDE HCL 5 MG PO TABS: 5.0000 mg | ORAL_TABLET | Freq: Three times a day (TID) | ORAL | Status: DC | PRN

## 2018-05-20 MED ORDER — METOPROLOL SUCCINATE ER 25 MG PO TB24
25.0000 mg | ORAL_TABLET | Freq: Every day | ORAL | Status: DC
  Administered 2018-05-21 – 2018-05-23 (×3): 25 mg via ORAL
  Filled 2018-05-20 (×3): qty 1

## 2018-05-20 MED ORDER — LIDOCAINE 2% (20 MG/ML) 5 ML SYRINGE
INTRAMUSCULAR | Status: AC
  Filled 2018-05-20: qty 5

## 2018-05-20 MED ORDER — ACETAMINOPHEN 325 MG PO TABS
325.0000 mg | ORAL_TABLET | Freq: Four times a day (QID) | ORAL | Status: DC | PRN
  Administered 2018-05-23: 650 mg via ORAL
  Filled 2018-05-20: qty 2

## 2018-05-20 MED ORDER — FENTANYL CITRATE (PF) 100 MCG/2ML IJ SOLN
INTRAMUSCULAR | Status: AC
  Filled 2018-05-20: qty 2

## 2018-05-20 MED ORDER — BUPIVACAINE-EPINEPHRINE (PF) 0.25% -1:200000 IJ SOLN
INTRAMUSCULAR | Status: AC
  Filled 2018-05-20: qty 30

## 2018-05-20 MED ORDER — METOCLOPRAMIDE HCL 5 MG/ML IJ SOLN
5.0000 mg | Freq: Three times a day (TID) | INTRAMUSCULAR | Status: DC | PRN
  Administered 2018-05-20: 10 mg via INTRAVENOUS
  Filled 2018-05-20: qty 2

## 2018-05-20 MED ORDER — SODIUM CHLORIDE 0.9 % IR SOLN
Status: DC | PRN
  Administered 2018-05-20: 3000 mL

## 2018-05-20 MED ORDER — PHENYLEPHRINE 40 MCG/ML (10ML) SYRINGE FOR IV PUSH (FOR BLOOD PRESSURE SUPPORT)
PREFILLED_SYRINGE | INTRAVENOUS | Status: AC
  Filled 2018-05-20: qty 10

## 2018-05-20 MED ORDER — EPHEDRINE 5 MG/ML INJ
INTRAVENOUS | Status: AC
  Filled 2018-05-20: qty 10

## 2018-05-20 MED ORDER — HYDROCODONE-ACETAMINOPHEN 5-325 MG PO TABS
1.0000 | ORAL_TABLET | ORAL | Status: DC | PRN
  Administered 2018-05-20 – 2018-05-21 (×7): 2 via ORAL
  Administered 2018-05-22: 1 via ORAL
  Filled 2018-05-20 (×2): qty 2
  Filled 2018-05-20: qty 1
  Filled 2018-05-20 (×5): qty 2

## 2018-05-20 MED ORDER — ONDANSETRON HCL 4 MG PO TABS: 4.0000 mg | ORAL_TABLET | Freq: Four times a day (QID) | ORAL | Status: DC | PRN

## 2018-05-20 MED ORDER — RISAQUAD PO CAPS
1.0000 | ORAL_CAPSULE | Freq: Every day | ORAL | Status: DC
  Administered 2018-05-20 – 2018-05-23 (×4): 1 via ORAL
  Filled 2018-05-20 (×4): qty 1

## 2018-05-20 MED ORDER — STERILE WATER FOR IRRIGATION IR SOLN
Status: DC | PRN
  Administered 2018-05-20: 2000 mL

## 2018-05-20 MED ORDER — BUPIVACAINE HCL (PF) 0.25 % IJ SOLN
INTRAMUSCULAR | Status: AC
  Filled 2018-05-20: qty 30

## 2018-05-20 MED ORDER — MIDAZOLAM HCL 2 MG/2ML IJ SOLN
INTRAMUSCULAR | Status: AC
  Filled 2018-05-20: qty 2

## 2018-05-20 MED ORDER — HYDROMORPHONE HCL 1 MG/ML IJ SOLN
0.2500 mg | INTRAMUSCULAR | Status: DC | PRN
  Administered 2018-05-20 (×4): 0.5 mg via INTRAVENOUS

## 2018-05-20 MED ORDER — ACETAMINOPHEN 10 MG/ML IV SOLN
1000.0000 mg | INTRAVENOUS | Status: AC
  Administered 2018-05-20: 1000 mg via INTRAVENOUS
  Filled 2018-05-20: qty 100

## 2018-05-20 MED ORDER — DEXAMETHASONE SODIUM PHOSPHATE 10 MG/ML IJ SOLN: INTRAMUSCULAR | Status: DC | PRN

## 2018-05-20 MED ORDER — TRANEXAMIC ACID 1000 MG/10ML IV SOLN
1000.0000 mg | INTRAVENOUS | Status: AC
  Administered 2018-05-20: 1000 mg via INTRAVENOUS
  Filled 2018-05-20: qty 10

## 2018-05-20 MED ORDER — DIPHENHYDRAMINE HCL 12.5 MG/5ML PO ELIX: 12.5000 mg | ORAL_SOLUTION | ORAL | Status: DC | PRN

## 2018-05-20 MED ORDER — ASPIRIN EC 325 MG PO TBEC
325.0000 mg | DELAYED_RELEASE_TABLET | Freq: Every day | ORAL | Status: DC
  Administered 2018-05-21 – 2018-05-23 (×3): 325 mg via ORAL
  Filled 2018-05-20 (×3): qty 1

## 2018-05-20 MED ORDER — DEXAMETHASONE SODIUM PHOSPHATE 10 MG/ML IJ SOLN
INTRAMUSCULAR | Status: AC
  Filled 2018-05-20: qty 1

## 2018-05-20 MED ORDER — TIMOLOL MALEATE 0.5 % OP SOLN
1.0000 [drp] | Freq: Two times a day (BID) | OPHTHALMIC | Status: DC
  Administered 2018-05-20 – 2018-05-23 (×6): 1 [drp] via OPHTHALMIC
  Filled 2018-05-20: qty 5

## 2018-05-20 MED ORDER — HYDROMORPHONE HCL 1 MG/ML IJ SOLN
INTRAMUSCULAR | Status: AC
  Filled 2018-05-20: qty 1

## 2018-05-20 MED ORDER — POLYETHYLENE GLYCOL 3350 17 G PO PACK: 17.0000 g | PACK | Freq: Every day | ORAL | Status: DC | PRN

## 2018-05-20 MED ORDER — PROPOFOL 10 MG/ML IV BOLUS
INTRAVENOUS | Status: AC
  Filled 2018-05-20: qty 60

## 2018-05-20 MED ORDER — ONDANSETRON HCL 4 MG/2ML IJ SOLN
INTRAMUSCULAR | Status: DC | PRN
  Administered 2018-05-20: 4 mg via INTRAVENOUS

## 2018-05-20 MED ORDER — GABAPENTIN 100 MG PO CAPS
100.0000 mg | ORAL_CAPSULE | Freq: Three times a day (TID) | ORAL | Status: DC
Start: 2018-05-20 — End: 2018-05-22
  Administered 2018-05-20 – 2018-05-21 (×4): 100 mg via ORAL
  Filled 2018-05-20 (×5): qty 1

## 2018-05-20 MED ORDER — KCL IN DEXTROSE-NACL 20-5-0.45 MEQ/L-%-% IV SOLN
INTRAVENOUS | Status: AC
  Administered 2018-05-20: 17:00:00 via INTRAVENOUS
  Filled 2018-05-20 (×2): qty 1000

## 2018-05-20 MED ORDER — BUPIVACAINE-EPINEPHRINE 0.25% -1:200000 IJ SOLN
INTRAMUSCULAR | Status: DC | PRN
  Administered 2018-05-20: 25 mL

## 2018-05-20 MED ORDER — ONDANSETRON HCL 4 MG/2ML IJ SOLN
INTRAMUSCULAR | Status: AC
  Filled 2018-05-20: qty 2

## 2018-05-20 MED ORDER — FENTANYL CITRATE (PF) 100 MCG/2ML IJ SOLN: 100.0000 ug | Freq: Once | INTRAMUSCULAR | Status: AC

## 2018-05-20 MED ORDER — PROPOFOL 10 MG/ML IV BOLUS
INTRAVENOUS | Status: DC | PRN
  Administered 2018-05-20: 120 mg via INTRAVENOUS

## 2018-05-20 MED ORDER — BUPIVACAINE-EPINEPHRINE (PF) 0.5% -1:200000 IJ SOLN
INTRAMUSCULAR | Status: DC | PRN
  Administered 2018-05-20: 25 mL

## 2018-05-20 MED ORDER — LEVOTHYROXINE SODIUM 75 MCG PO TABS
75.0000 ug | ORAL_TABLET | Freq: Every day | ORAL | Status: DC
  Administered 2018-05-21 – 2018-05-23 (×3): 75 ug via ORAL
  Filled 2018-05-20 (×3): qty 1

## 2018-05-20 MED ORDER — ALUM & MAG HYDROXIDE-SIMETH 200-200-20 MG/5ML PO SUSP: 30.0000 mL | ORAL | Status: DC | PRN

## 2018-05-20 MED ORDER — ROPIVACAINE HCL 7.5 MG/ML IJ SOLN
INTRAMUSCULAR | Status: DC | PRN
  Administered 2018-05-20: 20 mL via PERINEURAL

## 2018-05-20 MED ORDER — METHOCARBAMOL 500 MG PO TABS
500.0000 mg | ORAL_TABLET | Freq: Four times a day (QID) | ORAL | Status: DC | PRN
  Administered 2018-05-21 – 2018-05-23 (×8): 500 mg via ORAL
  Filled 2018-05-20 (×9): qty 1

## 2018-05-20 MED ORDER — ALBUTEROL SULFATE (2.5 MG/3ML) 0.083% IN NEBU: 3.0000 mL | INHALATION_SOLUTION | RESPIRATORY_TRACT | Status: DC | PRN

## 2018-05-20 MED ORDER — SODIUM CHLORIDE 0.9 % IV SOLN
INTRAVENOUS | Status: DC | PRN
  Administered 2018-05-20: 500 mL

## 2018-05-20 MED ORDER — MAGNESIUM CITRATE PO SOLN: 1.0000 | Freq: Once | ORAL | Status: DC | PRN

## 2018-05-20 MED ORDER — DOCUSATE SODIUM 100 MG PO CAPS
100.0000 mg | ORAL_CAPSULE | Freq: Two times a day (BID) | ORAL | Status: DC
  Administered 2018-05-20 – 2018-05-23 (×6): 100 mg via ORAL
  Filled 2018-05-20 (×6): qty 1

## 2018-05-20 MED ORDER — LIDOCAINE 2% (20 MG/ML) 5 ML SYRINGE
INTRAMUSCULAR | Status: DC | PRN
  Administered 2018-05-20: 60 mg via INTRAVENOUS

## 2018-05-20 MED ORDER — PHENOL 1.4 % MT LIQD
1.0000 | OROMUCOSAL | Status: DC | PRN
  Filled 2018-05-20: qty 177

## 2018-05-20 MED ORDER — VALACYCLOVIR HCL 500 MG PO TABS: 1000.0000 mg | ORAL_TABLET | Freq: Three times a day (TID) | ORAL | Status: DC

## 2018-05-20 MED ORDER — TRIAMTERENE-HCTZ 37.5-25 MG PO TABS
1.0000 | ORAL_TABLET | Freq: Every day | ORAL | Status: DC
  Administered 2018-05-21 – 2018-05-23 (×3): 1 via ORAL
  Filled 2018-05-20 (×3): qty 1

## 2018-05-20 MED ORDER — CLONIDINE HCL (ANALGESIA) 100 MCG/ML EP SOLN
EPIDURAL | Status: DC | PRN
  Administered 2018-05-20: 50 ug

## 2018-05-20 MED ORDER — FENTANYL CITRATE (PF) 100 MCG/2ML IJ SOLN
INTRAMUSCULAR | Status: DC | PRN
  Administered 2018-05-20 (×8): 25 ug via INTRAVENOUS

## 2018-05-20 MED ORDER — METHOCARBAMOL 500 MG IVPB - SIMPLE MED
500.0000 mg | Freq: Four times a day (QID) | INTRAVENOUS | Status: DC | PRN
  Administered 2018-05-20: 500 mg via INTRAVENOUS
  Filled 2018-05-20 (×2): qty 50
  Filled 2018-05-20: qty 500

## 2018-05-20 MED ORDER — FENTANYL CITRATE (PF) 100 MCG/2ML IJ SOLN
50.0000 ug | INTRAMUSCULAR | Status: AC | PRN
  Administered 2018-05-20 (×3): 50 ug via INTRAVENOUS

## 2018-05-20 MED ORDER — TOPIRAMATE 25 MG PO TABS: 25.0000 mg | ORAL_TABLET | Freq: Every day | ORAL | Status: DC | PRN

## 2018-05-20 MED ORDER — VITAMIN B-12 1000 MCG PO TABS
500.0000 ug | ORAL_TABLET | Freq: Every day | ORAL | Status: DC
  Administered 2018-05-20 – 2018-05-23 (×4): 500 ug via ORAL
  Filled 2018-05-20 (×4): qty 1

## 2018-05-20 MED ORDER — LACTATED RINGERS IV SOLN
INTRAVENOUS | Status: DC
  Administered 2018-05-20 (×2): via INTRAVENOUS

## 2018-05-20 MED ORDER — ONDANSETRON HCL 4 MG/2ML IJ SOLN
4.0000 mg | Freq: Four times a day (QID) | INTRAMUSCULAR | Status: DC | PRN
  Administered 2018-05-20: 4 mg via INTRAVENOUS
  Filled 2018-05-20: qty 2

## 2018-05-20 MED ORDER — VITAMIN C 500 MG PO TABS
500.0000 mg | ORAL_TABLET | Freq: Every day | ORAL | Status: DC
  Administered 2018-05-20 – 2018-05-23 (×4): 500 mg via ORAL
  Filled 2018-05-20 (×4): qty 1

## 2018-05-20 MED ORDER — ONDANSETRON HCL 4 MG/2ML IJ SOLN: 4.0000 mg | Freq: Once | INTRAMUSCULAR | Status: DC | PRN

## 2018-05-20 MED ORDER — EPHEDRINE SULFATE-NACL 50-0.9 MG/10ML-% IV SOSY
PREFILLED_SYRINGE | INTRAVENOUS | Status: DC | PRN
  Administered 2018-05-20 (×3): 5 mg via INTRAVENOUS

## 2018-05-20 SURGICAL SUPPLY — 86 items
AGENT HMST SPONGE THK3/8 (HEMOSTASIS)
ATTUNE PSFEM LTSZ4 NARCEM KNEE (Femur) ×2 IMPLANT
ATTUNE PSRP INSR SZ4 6 KNEE (Insert) ×1 IMPLANT
ATTUNE PSRP INSR SZ4 6MM KNEE (Insert) ×1 IMPLANT
BAG DECANTER FOR FLEXI CONT (MISCELLANEOUS) ×2 IMPLANT
BAG SPEC THK2 15X12 ZIP CLS (MISCELLANEOUS)
BAG ZIPLOCK 12X15 (MISCELLANEOUS) IMPLANT
BANDAGE ACE 4X5 VEL STRL LF (GAUZE/BANDAGES/DRESSINGS) ×3 IMPLANT
BANDAGE ACE 6X5 VEL STRL LF (GAUZE/BANDAGES/DRESSINGS) ×3 IMPLANT
BASE TIBIAL ROT PLAT SZ 5 KNEE (Knees) ×1 IMPLANT
BLADE SAG 18X100X1.27 (BLADE) ×3 IMPLANT
BLADE SAW SGTL 11.0X1.19X90.0M (BLADE) ×3 IMPLANT
BLADE SAW SGTL 13.0X1.19X90.0M (BLADE) ×3 IMPLANT
BLADE SURG SZ10 CARB STEEL (BLADE) ×2 IMPLANT
BSPLAT TIB 5 CMNT ROT PLAT STR (Knees) ×1 IMPLANT
CEMENT HV SMART SET (Cement) ×6 IMPLANT
CLOSURE WOUND 1/2 X4 (GAUZE/BANDAGES/DRESSINGS) ×1
CLOTH 2% CHLOROHEXIDINE 3PK (PERSONAL CARE ITEMS) ×3 IMPLANT
COVER SURGICAL LIGHT HANDLE (MISCELLANEOUS) ×3 IMPLANT
CUFF TOURN SGL QUICK 34 (TOURNIQUET CUFF) ×3
CUFF TRNQT CYL 34X4X40X1 (TOURNIQUET CUFF) ×1 IMPLANT
DECANTER SPIKE VIAL GLASS SM (MISCELLANEOUS) ×5 IMPLANT
DRAPE INCISE IOBAN 66X45 STRL (DRAPES) IMPLANT
DRAPE LG THREE QUARTER DISP (DRAPES) ×3 IMPLANT
DRAPE ORTHO SPLIT 77X108 STRL (DRAPES) ×6
DRAPE SHEET LG 3/4 BI-LAMINATE (DRAPES) ×5 IMPLANT
DRAPE SURG ORHT 6 SPLT 77X108 (DRAPES) ×2 IMPLANT
DRAPE U-SHAPE 47X51 STRL (DRAPES) ×3 IMPLANT
DRSG AQUACEL AG ADV 3.5X10 (GAUZE/BANDAGES/DRESSINGS) ×2 IMPLANT
DRSG TEGADERM 4X4.75 (GAUZE/BANDAGES/DRESSINGS) IMPLANT
DURAPREP 26ML APPLICATOR (WOUND CARE) ×3 IMPLANT
ELECT BLADE TIP CTD 4 INCH (ELECTRODE) ×5 IMPLANT
ELECT REM PT RETURN 15FT ADLT (MISCELLANEOUS) ×3 IMPLANT
EVACUATOR 1/8 PVC DRAIN (DRAIN) IMPLANT
FACESHIELD WRAPAROUND (MASK) ×3 IMPLANT
FACESHIELD WRAPAROUND OR TEAM (MASK) IMPLANT
GAUZE SPONGE 2X2 8PLY STRL LF (GAUZE/BANDAGES/DRESSINGS) IMPLANT
GLOVE BIOGEL PI IND STRL 7.5 (GLOVE) ×1 IMPLANT
GLOVE BIOGEL PI IND STRL 8 (GLOVE) ×1 IMPLANT
GLOVE BIOGEL PI INDICATOR 7.5 (GLOVE) ×12
GLOVE BIOGEL PI INDICATOR 8 (GLOVE) ×2
GLOVE SURG SS PI 7.5 STRL IVOR (GLOVE) ×12 IMPLANT
GLOVE SURG SS PI 8.0 STRL IVOR (GLOVE) ×6 IMPLANT
GOWN STRL REUS W/ TWL LRG LVL3 (GOWN DISPOSABLE) IMPLANT
GOWN STRL REUS W/ TWL XL LVL3 (GOWN DISPOSABLE) IMPLANT
GOWN STRL REUS W/TWL LRG LVL3 (GOWN DISPOSABLE) ×3
GOWN STRL REUS W/TWL XL LVL3 (GOWN DISPOSABLE) ×12 IMPLANT
HANDPIECE INTERPULSE COAX TIP (DISPOSABLE) ×3
HEMOSTAT SPONGE AVITENE ULTRA (HEMOSTASIS) ×1 IMPLANT
HOLDER FOLEY CATH W/STRAP (MISCELLANEOUS) ×3 IMPLANT
IMMOBILIZER KNEE 20 (SOFTGOODS) ×3
IMMOBILIZER KNEE 20 THIGH 36 (SOFTGOODS) ×1 IMPLANT
MANIFOLD NEPTUNE II (INSTRUMENTS) ×3 IMPLANT
NDL SAFETY ECLIPSE 18X1.5 (NEEDLE) IMPLANT
NEEDLE HYPO 18GX1.5 SHARP (NEEDLE)
NS IRRIG 1000ML POUR BTL (IV SOLUTION) IMPLANT
PACK TOTAL KNEE CUSTOM (KITS) ×3 IMPLANT
PATELLA MEDIAL ATTUN 35MM KNEE (Knees) ×2 IMPLANT
POSITIONER SURGICAL ARM (MISCELLANEOUS) ×3 IMPLANT
SEALER BIPOLAR AQUA 6.0 (INSTRUMENTS) IMPLANT
SET HNDPC FAN SPRY TIP SCT (DISPOSABLE) ×1 IMPLANT
SPONGE GAUZE 2X2 STER 10/PKG (GAUZE/BANDAGES/DRESSINGS)
SPONGE SURGIFOAM ABS GEL 100 (HEMOSTASIS) IMPLANT
STAPLER VISISTAT (STAPLE) IMPLANT
STRIP CLOSURE SKIN 1/2X4 (GAUZE/BANDAGES/DRESSINGS) ×1 IMPLANT
SUT BONE WAX W31G (SUTURE) IMPLANT
SUT MNCRL AB 4-0 PS2 18 (SUTURE) ×3 IMPLANT
SUT STRATAFIX 0 PDS 27 VIOLET (SUTURE) ×3
SUT VIC AB 1 CT1 27 (SUTURE) ×18
SUT VIC AB 1 CT1 27XBRD ANTBC (SUTURE) ×6 IMPLANT
SUT VIC AB 1 CTX 36 (SUTURE)
SUT VIC AB 1 CTX36XBRD ANBCTR (SUTURE) IMPLANT
SUT VIC AB 2-0 CT1 27 (SUTURE) ×9
SUT VIC AB 2-0 CT1 TAPERPNT 27 (SUTURE) ×3 IMPLANT
SUT VIC AB 2-0 SH 27 (SUTURE) ×3
SUT VIC AB 2-0 SH 27X BRD (SUTURE) IMPLANT
SUTURE STRATFX 0 PDS 27 VIOLET (SUTURE) ×1 IMPLANT
SYR 3ML LL SCALE MARK (SYRINGE) IMPLANT
SYR 50ML LL SCALE MARK (SYRINGE) IMPLANT
TIBIAL BASE ROT PLAT SZ 5 KNEE (Knees) ×3 IMPLANT
TOWER CARTRIDGE SMART MIX (DISPOSABLE) ×3 IMPLANT
TRAY FOLEY CATH 14FRSI W/METER (CATHETERS) ×2 IMPLANT
TRAY FOLEY MTR SLVR 16FR STAT (SET/KITS/TRAYS/PACK) IMPLANT
WATER STERILE IRR 1000ML POUR (IV SOLUTION) ×3 IMPLANT
WRAP KNEE MAXI GEL POST OP (GAUZE/BANDAGES/DRESSINGS) ×3 IMPLANT
YANKAUER SUCT BULB TIP 10FT TU (MISCELLANEOUS) ×3 IMPLANT

## 2018-05-20 NOTE — Evaluation (Signed)
Physical Therapy Evaluation Patient Details Name: Jocelyn Wiley MRN: [id number] DOB: 23-Jan-1951 Today's Date: 05/20/2018   History of Present Illness  Pt is a 67 YO female s/p L TKR on 9/4. PMH includes anemia, anxiety, asthma, CKD I, DDD, DJD, glaucoma, heart murmur, hypothyroidism, lumbar disc herniation, migraines, OA. Surgical history includes R TKR, back surgery, partial thyroidectomy.   Clinical Impression   Pt s/p L TKR. Pt presents with L knee pain, difficulty with mobility tasks, mild L knee buckling with WB, and decreased tolerance for mobility. Pt would benefit from acute PT to address deficits. Pt ambulated 6 ft to recliner in room today, due to nausea, high BP, and L knee buckling. PT to follow up tomorrow. Will continue to progress mobility as able.     Follow Up Recommendations Follow surgeon's recommendation for DC plan and follow-up therapies;Supervision for mobility/OOB    Equipment Recommendations  None recommended by PT    Recommendations for Other Services       Precautions / Restrictions Precautions Precautions: Fall Restrictions Weight Bearing Restrictions: No Other Position/Activity Restrictions: WBAT       Mobility  Bed Mobility Overal bed mobility: Needs Assistance Bed Mobility: Supine to Sit     Supine to sit: Min guard;HOB elevated     General bed mobility comments: Min guard for safety. Increased time to perform. Pt with nausea prior to beginning session, not increased with bed mobility.   Transfers Overall transfer level: Needs assistance Equipment used: Rolling walker (2 wheeled) Transfers: Sit to/from Stand Sit to Stand: Min guard         General transfer comment: Min guard for safety. Weight shift L and R without noticable buckling. BP 187/76 with pulse of 66 sitting EOB. PT opted for few steps to chair only due to pt's nausea and high BP.   Ambulation/Gait Ambulation/Gait assistance: Min assist Gait Distance (Feet): 6  Feet Assistive device: Rolling walker (2 wheeled) Gait Pattern/deviations: Step-to pattern;Decreased stride length;Decreased stance time - left;Decreased weight shift to left;Antalgic;Trunk flexed Gait velocity: decr   General Gait Details: Min assist for L quad facilitation due to L knee buckling. Buckling was mild.   Stairs            Wheelchair Mobility    Modified Rankin (Stroke Patients Only)       Balance Overall balance assessment: Mild deficits observed, not formally tested                                           Pertinent Vitals/Pain Pain Assessment: 0-10 Pain Score: 2  Pain Location: L knee  Pain Descriptors / Indicators: Operative site guarding Pain Intervention(s): Limited activity within patient's tolerance;Monitored during session;Premedicated before session;Repositioned    Home Living Family/patient expects to be discharged to:: Private residence Living Arrangements: Spouse/significant other;Children Available Help at Discharge: Family;Available 24 hours/day Type of Home: House Home Access: Stairs to enter Entrance Stairs-Rails: Can reach both;Right;Left Entrance Stairs-Number of Steps: 4 Home Layout: One level Home Equipment: Walker - 2 wheels;Crutches;Electric scooter      Prior Function Level of Independence: Independent with assistive device(s)         Comments: using knee brace and cane prior to coming to hospital      Hand Dominance   Dominant Hand: Right    Extremity/Trunk Assessment   Upper Extremity Assessment Upper Extremity Assessment: Overall WFL for tasks  assessed    Lower Extremity Assessment Lower Extremity Assessment: LLE deficits/detail LLE Deficits / Details: suspected post-surgical LLE weakness; able to perform 5 quad sets, difficulty with SLR but able to perform with active assist, L knee buckling upon standing.  LLE Sensation: WNL       Communication   Communication: No difficulties   Cognition Arousal/Alertness: Awake/alert Behavior During Therapy: WFL for tasks assessed/performed Overall Cognitive Status: Within Functional Limits for tasks assessed                                        General Comments      Exercises Total Joint Exercises Ankle Circles/Pumps: AROM;Both;10 reps;Supine Quad Sets: AROM;Left;5 reps;Supine   Assessment/Plan    PT Assessment Patient needs continued PT services  PT Problem List Decreased strength;Pain;Decreased range of motion;Decreased activity tolerance;Decreased balance;Decreased mobility;Decreased knowledge of use of DME       PT Treatment Interventions DME instruction;Therapeutic activities;Gait training;Therapeutic exercise;Patient/family education;Stair training;Balance training;Functional mobility training    PT Goals (Current goals can be found in the Care Plan section)  Acute Rehab PT Goals PT Goal Formulation: With patient Time For Goal Achievement: 06/03/18 Potential to Achieve Goals: Good    Frequency 7X/week   Barriers to discharge        Co-evaluation               AM-PAC PT "6 Clicks" Daily Activity  Outcome Measure Difficulty turning over in bed (including adjusting bedclothes, sheets and blankets)?: Unable Difficulty moving from lying on back to sitting on the side of the bed? : Unable Difficulty sitting down on and standing up from a chair with arms (e.g., wheelchair, bedside commode, etc,.)?: Unable Help needed moving to and from a bed to chair (including a wheelchair)?: A Little Help needed walking in hospital room?: A Little Help needed climbing 3-5 steps with a railing? : A Lot 6 Click Score: 11    End of Session Equipment Utilized During Treatment: Gait belt Activity Tolerance: Patient tolerated treatment well;Treatment limited secondary to medical complications (Comment)(high BP, nausea) Patient left: in chair;with chair alarm set;with call bell/phone within reach;with  family/visitor present; SCDs reapplied Nurse Communication: Mobility status PT Visit Diagnosis: Other abnormalities of gait and mobility (R26.89);Difficulty in walking, not elsewhere classified (R26.2)    Time: 4098-1191 PT Time Calculation (min) (ACUTE ONLY): 25 min   Charges:   PT Evaluation $PT Eval Low Complexity: 1 Low PT Treatments $Therapeutic Activity: 8-22 mins       Sephora Boyar Terrial Rhodes, PT, DPT  Pager # [phone]    Jolin Benavides D Lev Cervone 05/20/2018, 7:37 PM

## 2018-05-20 NOTE — Progress Notes (Signed)
Assisted Dr. Rob Fitzgerald with left, ultrasound guided, adductor canal block. Side rails up, monitors on throughout procedure. See vital signs in flow sheet. Tolerated Procedure well.  

## 2018-05-20 NOTE — Anesthesia Preprocedure Evaluation (Addendum)
Anesthesia Evaluation  Patient identified by MRN, date of birth, ID band Patient awake    Reviewed: Allergy & Precautions, NPO status , Patient's Chart, lab work & pertinent test results  History of Anesthesia Complications (+) PONV  Airway Mallampati: II  TM Distance: >3 FB Neck ROM: Full    Dental  (+) Dental Advisory Given   Pulmonary asthma ,    breath sounds clear to auscultation       Cardiovascular hypertension, Pt. on medications and Pt. on home beta blockers  Rhythm:Regular Rate:Normal     Neuro/Psych  Headaches,    GI/Hepatic negative GI ROS, Neg liver ROS,   Endo/Other  Hypothyroidism   Renal/GU CRFRenal disease     Musculoskeletal  (+) Arthritis ,   Abdominal   Peds  Hematology negative hematology ROS (+) REFUSES BLOOD PRODUCTS, JEHOVAH'S WITNESS  Anesthesia Other Findings   Reproductive/Obstetrics                            Lab Results  Component Value Date   WBC 5.2 05/12/2018   HGB 12.2 05/12/2018   HCT 37.4 05/12/2018   MCV 84.8 05/12/2018   PLT 266 05/12/2018   Lab Results  Component Value Date   CREATININE 1.22 (H) 05/12/2018   BUN 18 05/12/2018   NA 138 05/12/2018   K 4.4 05/12/2018   CL 99 05/12/2018   CO2 29 05/12/2018    Anesthesia Physical Anesthesia Plan  ASA: II  Anesthesia Plan: General   Post-op Pain Management:  Regional for Post-op pain   Induction: Intravenous  PONV Risk Score and Plan: 3 and Propofol infusion, Dexamethasone, Ondansetron and Treatment may vary due to age or medical condition  Airway Management Planned: Natural Airway and Simple Face Mask  Additional Equipment:   Intra-op Plan:   Post-operative Plan:   Informed Consent: I have reviewed the patients History and Physical, chart, labs and discussed the procedure including the risks, benefits and alternatives for the proposed anesthesia with the patient or authorized  representative who has indicated his/her understanding and acceptance.     Plan Discussed with: CRNA  Anesthesia Plan Comments: (Pt refuses spinal)       Anesthesia Quick Evaluation

## 2018-05-20 NOTE — Transfer of Care (Signed)
Immediate Anesthesia Transfer of Care Note  Patient: Jocelyn Wiley  Procedure(s) Performed: LEFT TOTAL KNEE ARTHROPLASTY (Left Knee)  Patient Location: PACU  Anesthesia Type:General  Level of Consciousness: awake, alert , oriented and patient cooperative  Airway & Oxygen Therapy: Patient Spontanous Breathing and Patient connected to face mask  Post-op Assessment: Report given to RN and Post -op Vital signs reviewed and stable  Post vital signs: Reviewed and stable  Last Vitals:  Vitals Value Taken Time  BP 181/90 05/20/2018  1:34 PM  Temp    Pulse 70 05/20/2018  1:36 PM  Resp 9 05/20/2018  1:36 PM  SpO2 100 % 05/20/2018  1:36 PM  Vitals shown include unvalidated device data.  Last Pain:  Vitals:   05/20/18 0905  TempSrc: Oral  PainSc: 0-No pain         Complications: No apparent anesthesia complications

## 2018-05-20 NOTE — Interval H&P Note (Signed)
History and Physical Interval Note:  05/20/2018 10:45 AM  Jocelyn Wiley  has presented today for surgery, with the diagnosis of Left knee degenerative joint disease  The various methods of treatment have been discussed with the patient and family. After consideration of risks, benefits and other options for treatment, the patient has consented to  Procedure(s) with comments: LEFT TOTAL KNEE ARTHROPLASTY (Left) - 120 mins as a surgical intervention .  The patient's history has been reviewed, patient examined, no change in status, stable for surgery.  I have reviewed the patient's chart and labs.  Questions were answered to the patient's satisfaction.     [name] C

## 2018-05-20 NOTE — Anesthesia Procedure Notes (Addendum)
Anesthesia Regional Block: Adductor canal block   Pre-Anesthetic Checklist: ,, timeout performed, Correct Patient, Correct Site, Correct Laterality, Correct Procedure, Correct Position, site marked, Risks and benefits discussed,  Surgical consent,  Pre-op evaluation,  At surgeon's request and post-op pain management  Laterality: Left  Prep: chloraprep       Needles:  Injection technique: Single-shot  Needle Type: Echogenic Needle     Needle Length: 9cm  Needle Gauge: 21     Additional Needles:   Procedures:,,,, ultrasound used (permanent image in chart),,,,  Narrative:  Start time: 05/20/2018 10:17 AM End time: 05/20/2018 10:23 AM Injection made incrementally with aspirations every 5 mL.  Performed by: Personally  Anesthesiologist: Marcene Duos, MD

## 2018-05-20 NOTE — Anesthesia Procedure Notes (Signed)
Procedure Name: LMA Insertion Date/Time: 05/20/2018 11:28 AM Performed by: Vanessa Nageezi, CRNA Pre-anesthesia Checklist: Emergency Drugs available, Patient identified, Suction available and Patient being monitored Patient Re-evaluated:Patient Re-evaluated prior to induction Oxygen Delivery Method: Circle system utilized Preoxygenation: Pre-oxygenation with 100% oxygen Induction Type: IV induction Ventilation: Mask ventilation without difficulty LMA: LMA inserted LMA Size: 4.0 Number of attempts: 1 Placement Confirmation: positive ETCO2 and breath sounds checked- equal and bilateral Tube secured with: Tape Dental Injury: Teeth and Oropharynx as per pre-operative assessment

## 2018-05-20 NOTE — Discharge Instructions (Signed)

## 2018-05-21 ENCOUNTER — Encounter (HOSPITAL_COMMUNITY): Payer: Self-pay | Admitting: Specialist

## 2018-05-21 LAB — CBC
HEMATOCRIT: 31 % — AB (ref 36.0–46.0)
Hemoglobin: 10.4 g/dL — ABNORMAL LOW (ref 12.0–15.0)
MCH: 28.3 pg (ref 26.0–34.0)
MCHC: 33.5 g/dL (ref 30.0–36.0)
MCV: 84.5 fL (ref 78.0–100.0)
PLATELETS: 195 10*3/uL (ref 150–400)
RBC: 3.67 MIL/uL — ABNORMAL LOW (ref 3.87–5.11)
RDW: 13.4 % (ref 11.5–15.5)
WBC: 7.3 10*3/uL (ref 4.0–10.5)

## 2018-05-21 LAB — BASIC METABOLIC PANEL
ANION GAP: 11 (ref 5–15)
BUN: 10 mg/dL (ref 8–23)
CO2: 29 mmol/L (ref 22–32)
Calcium: 8.5 mg/dL — ABNORMAL LOW (ref 8.9–10.3)
Chloride: 99 mmol/L (ref 98–111)
Creatinine, Ser: 1.06 mg/dL — ABNORMAL HIGH (ref 0.44–1.00)
GFR calc non Af Amer: 53 mL/min — ABNORMAL LOW (ref >60)
Glucose, Bld: 158 mg/dL — ABNORMAL HIGH (ref 70–99)
POTASSIUM: 3.9 mmol/L (ref 3.5–5.1)
Sodium: 139 mmol/L (ref 135–145)

## 2018-05-21 MED ORDER — ASPIRIN 325 MG PO TBEC: 325.0000 mg | DELAYED_RELEASE_TABLET | Freq: Two times a day (BID) | ORAL | 1 refills | Status: DC

## 2018-05-21 MED ORDER — TRAMADOL HCL 50 MG PO TABS: 50.0000 mg | ORAL_TABLET | Freq: Four times a day (QID) | ORAL | 1 refills | Status: DC | PRN

## 2018-05-21 MED ORDER — HYDROCODONE-ACETAMINOPHEN 5-325 MG PO TABS: 1.0000 | ORAL_TABLET | ORAL | 0 refills | Status: DC | PRN

## 2018-05-21 MED ORDER — HYDROMORPHONE HCL 1 MG/ML IJ SOLN
1.0000 mg | INTRAMUSCULAR | Status: DC | PRN
  Administered 2018-05-21 – 2018-05-22 (×3): 1 mg via INTRAVENOUS
  Filled 2018-05-21 (×3): qty 1

## 2018-05-21 MED ORDER — POLYETHYLENE GLYCOL 3350 17 G PO PACK: 17.0000 g | PACK | Freq: Every day | ORAL | 1 refills | Status: DC | PRN

## 2018-05-21 MED ORDER — ONDANSETRON HCL 4 MG PO TABS: 4.0000 mg | ORAL_TABLET | Freq: Four times a day (QID) | ORAL | 1 refills | Status: DC | PRN

## 2018-05-21 MED ORDER — DOCUSATE SODIUM 100 MG PO CAPS: 100.0000 mg | ORAL_CAPSULE | Freq: Two times a day (BID) | ORAL | 1 refills | Status: DC

## 2018-05-21 NOTE — Brief Op Note (Signed)
05/20/2018  9:39 AM  PATIENT:  Jocelyn Wiley  67 y.o. female  PRE-OPERATIVE DIAGNOSIS:  Left knee degenerative joint disease  POST-OPERATIVE DIAGNOSIS:  Left knee degenerative joint disease  PROCEDURE:  Procedure(s) with comments: LEFT TOTAL KNEE ARTHROPLASTY (Left) - 120 mins  SURGEON:  Surgeon(s) and Role:    Jene Every, MD - Primary  PHYSICIAN ASSISTANT:   ASSISTANTS: Bissell   ANESTHESIA:   general  EBL:  25 mL   BLOOD ADMINISTERED:none  DRAINS: none   LOCAL MEDICATIONS USED:  MARCAINE     SPECIMEN:  No Specimen  DISPOSITION OF SPECIMEN:  N/A  COUNTS:  YES  TOURNIQUET:   Total Tourniquet Time Documented: Thigh (Left) - 57 minutes Total: Thigh (Left) - 57 minutes   DICTATION: .Other Dictation: Dictation Number [id number]  PLAN OF CARE: Admit to inpatient   PATIENT DISPOSITION:  PACU - hemodynamically stable.   Delay start of Pharmacological VTE agent (>24hrs) due to surgical blood loss or risk of bleeding: no

## 2018-05-21 NOTE — Progress Notes (Signed)
CSW consult-SNF Plan: Plan discharge to home post-op with family members at home for assistance, to start outpt PT Monday 9/9. CSW will sign off.   Vivi Barrack, [place], MSW Clinical Social Worker  [phone] 05/21/2018  8:41 AM

## 2018-05-21 NOTE — Op Note (Signed)
NAME: Jocelyn Wiley, [place] MED[facility] RECORD ZO:[id number] ACCOUNT [id number] DATE OF BIRTH:1950-12-15 FACILITY: [facility] LOCATION: [facility] PHYSICIAN:Ovidio Steele Connye Burkitt, MD  OPERATIVE REPORT  DATE OF PROCEDURE:  05/20/2018  PREOPERATIVE DIAGNOSIS:  End-stage osteoarthritis of the left knee.  POSTOPERATIVE DIAGNOSIS:  End-stage osteoarthritis of the left knee.  PROCEDURE PERFORMED:  Left total knee arthroplasty utilizing the DePuy Attune rotating platform, 5 tibia, 4 narrow femur, 6 mm insert, 35 patella.  ANESTHESIA:  General.  ASSISTANT:  Andrez Grime, PA  HISTORY:  A 67 year old with end-stage osteoarthrosis of the left knee, bone-on-bone, varus deformity, indicated for replacement of the degenerative joint.  Risks and benefits discussed including bleeding, infection, damage to neurovascular structures,  no change in symptoms, worsening symptoms, DVT, PE, anesthetic complications, need for manipulation, hardware loosening, etc.  She had negative affect to her activities of daily living.  Failed conservative treatment.  TECHNIQUE:  With the patient in supine position after induction of adequate general anesthesia, 2 g Kefzol, the left lower extremity was prepped, draped, and exsanguinated in the usual sterile fashion.  A thigh tourniquet inflated to 250 mmHg.  A midline  incision was then made over the knee.  Full-thickness flaps developed.  Median parapatellar arthrotomy was performed.  Patella everted, the knee flexed.  Tricompartmental osteoarthrosis was noted.  Particularly medially, bone-on-bone posteromedially.  A  L[place]ell rongeur was utilized to remove the medial and lateral menisci remnants, osteophytes.  We elevated soft tissues medially, preserving the MCL.  A step drill was utilized, and the femoral canal was irrigated.  A 5-degree left was utilized with a 9  off the distal femur.  This was then pinned, and I performed our cut with soft tissues protected at all times.  Then subluxed the  tibia and measured off the medial side, too.  External alignment guide parallel to the shaft bisecting the tibiotalar  degenerative joint.  Slight increase in slope of 4 degrees to capture posteromedially.  Pinned this and then performed our tibial cut.  There was some mild residual 1 x 1 cm eburnated bone medially, which we put some drill holes in that.  Using our  extension block, it was satisfactory at a 6.  I flexed the knee, subluxed the tibia, completed the tibia.  It measured to a 5.  Just the medial aspect of the tibial tubercle was pinned, centrally drilled, harvested bone and placed it in the distal femur.   Used our punch guide.  Trial tibia.  Then, we turned our attention to the femur.  We used our box cut.  We measured off the anterior cortex of the femur.  This was a 4 on the high side.  She had internally rotated femur.  I then performed anterior and  posterior chamfer cuts, protecting soft tissues posteriorly at all times.  I did not notch the femur.  I had to use an osteotome posteriorly with the soft tissues protected with a wide curved Crego.  I then performed our box cut.  Bisecting the canal.  I  placed a trial femur.  Drilled our lug holes.  It sat satisfactorily.  Placed a 6 mm insert, reduced it and had it in full extension.  Good flexion, good extension, good stability to varus-valgus stress in 0 to 30 degrees.  Negative anterior drawer.   Cauterized geniculates.  Everted the patella.  It was fairly small.  Planed it to 15 mm thickness.  Utilized an external alignment guide.  We drilled our peg holes, medializing them.  We  used a 35 paddle for that purpose parallel to the joint.  Trial  patella placed.  It was reduced and had excellent patellofemoral tracking.  All trial instrumentation was removed.  Pulsatile lavage was used to clean all surfaces thoroughly.  We flexed the knee, placed in our retractors.  All surfaces thoroughly dried.   Next, cement on the back table and injected  into the tibial canal, digitally pressurizing it.  Cemented and impacted in the tibia, cement on both sides.  Cemented and impacted the femur, cement on both sides.  We placed a trial 6 insert, reduced it and  held the axial load throughout the curing of the cement.  Cemented and clamped the patella.  Redundant cement removed after curing the cement.  Tourniquet was deflated at 57 minutes.  Any bleeding was cauterized.  There was minimal.  Flexed the knee.   Meticulously removed all redundant cement.  Removed the trial insert.  Copiously irrigated with pulsatile lavage and antibiotic irrigation.  Placed a permanent 6 insert.  Reduced it.  It had full extension, full flexion, good stability to varus-valgus  stress 0 to 30 degrees.  Negative anterior drawer.  Next, slight flexion.  Reapproximated the patellar arthrotomy with #1 Vicryl interrupted figure-of-eight sutures oversewn with a running StrataFix.  It had excellent patellofemoral tracking after that.  Subcu with 2-0 and skin with Monocryl.  She had  flexion to 90 degrees after closure and then passively to 140.  A sterile dressing was applied, placed in an immobilizer, extubated and transported to the recovery room in satisfactory condition.  The patient tolerated the procedure well.  No complications.  Assistant Andrez Grime, [place].  Minimal blood loss.  LN/NUANCE  D:05/21/2018 T:05/21/2018 JOB:[id number]

## 2018-05-21 NOTE — Progress Notes (Signed)
Physical Therapy Treatment Patient Details Name: Jocelyn Wiley MRN: [id number] DOB: 1950/11/11 Today's Date: 05/21/2018    History of Present Illness Pt is a 67 YO female s/p L TKR on 9/4. PMH includes anemia, anxiety, asthma, CKD I, DDD, DJD, glaucoma, heart murmur, hypothyroidism, lumbar disc herniation, migraines, OA. Surgical history includes R TKR, back surgery, partial thyroidectomy.     PT Comments    Patient progressing with mobility able to negotiate stairs this pm appropriate for home entry.  Feel will be stable for d/c home with family assist in AM.  Will follow if not discharged.   Follow Up Recommendations  Follow surgeon's recommendation for DC plan and follow-up therapies;Supervision for mobility/OOB     Equipment Recommendations  None recommended by PT    Recommendations for Other Services       Precautions / Restrictions Precautions Precautions: Fall Restrictions Other Position/Activity Restrictions: WBAT     Mobility  Bed Mobility   Bed Mobility: Sit to Supine     Supine to sit: Supervision;HOB elevated Sit to supine: Min guard   General bed mobility comments: to guide L leg onto bed  Transfers Overall transfer level: Needs assistance Equipment used: Rolling walker (2 wheeled) Transfers: Sit to/from Stand Sit to Stand: Min guard         General transfer comment: assist for balance  Ambulation/Gait Ambulation/Gait assistance: Supervision Gait Distance (Feet): 150 Feet Assistive device: Rolling walker (2 wheeled) Gait Pattern/deviations: Step-to pattern;Decreased stride length     General Gait Details: cues for forward gaze, no cues needed for sequence   Stairs Stairs: Yes Stairs assistance: Min guard Stair Management: Two rails;Step to pattern;Forwards Number of Stairs: 2(x 2) General stair comments: cues for sequence, assist for safety; educated how to have family assist   Wheelchair Mobility    Modified Rankin (Stroke  Patients Only)       Balance Overall balance assessment: Needs assistance   Sitting balance-Leahy Scale: Good       Standing balance-Leahy Scale: Fair Standing balance comment: washing hands at sink and toileting with S                            Cognition Arousal/Alertness: Awake/alert Behavior During Therapy: WFL for tasks assessed/performed Overall Cognitive Status: Within Functional Limits for tasks assessed                                        Exercises Total Joint Exercises Heel Slides: AROM;10 reps;Seated;Left Long Arc Quad: AROM;10 reps;Left;Seated    General Comments        Pertinent Vitals/Pain Pain Score: 8  Pain Location: L knee  Pain Descriptors / Indicators: Sore;Tightness Pain Intervention(s): Premedicated before session;Monitored during session;Ice applied    Home Living                      Prior Function            PT Goals (current goals can now be found in the care plan section) Progress towards PT goals: Progressing toward goals    Frequency    7X/week      PT Plan Current plan remains appropriate    Co-evaluation              AM-PAC PT "6 Clicks" Daily Activity  Outcome Measure  Difficulty turning over in  bed (including adjusting bedclothes, sheets and blankets)?: A Little Difficulty moving from lying on back to sitting on the side of the bed? : A Little Difficulty sitting down on and standing up from a chair with arms (e.g., wheelchair, bedside commode, etc,.)?: Unable Help needed moving to and from a bed to chair (including a wheelchair)?: A Little Help needed walking in hospital room?: A Little Help needed climbing 3-5 steps with a railing? : A Little 6 Click Score: 16    End of Session Equipment Utilized During Treatment: Gait belt Activity Tolerance: Patient tolerated treatment well Patient left: in bed;with call bell/phone within reach   PT Visit Diagnosis: Other  abnormalities of gait and mobility (R26.89);Difficulty in walking, not elsewhere classified (R26.2)     Time: 1103-1594 PT Time Calculation (min) (ACUTE ONLY): 34 min  Charges:  $Gait Training: 8-22 mins $Therapeutic Exercise: 8-22 mins                     [place], [place] [phone] 05/21/2018    Elray Mcgregor 05/21/2018, 3:11 PM

## 2018-05-21 NOTE — Progress Notes (Signed)
Physical Therapy Treatment Patient Details Name: Jocelyn Wiley MRN: [id number] DOB: 30-Jul-1951 Today's Date: 05/21/2018    History of Present Illness Pt is a 67 YO female s/p L TKR on 9/4. PMH includes anemia, anxiety, asthma, CKD I, DDD, DJD, glaucoma, heart murmur, hypothyroidism, lumbar disc herniation, migraines, OA. Surgical history includes R TKR, back surgery, partial thyroidectomy.     PT Comments    Patient progressing with mobility and able to demonstrate hallway ambulation.  Also with good progress on knee ROM and strength without signs of buckling with ambulation.  Will see later for progression on steps prior to d/c home.    Follow Up Recommendations  Follow surgeon's recommendation for DC plan and follow-up therapies;Supervision for mobility/OOB     Equipment Recommendations  None recommended by PT    Recommendations for Other Services       Precautions / Restrictions Precautions Precautions: Fall Restrictions Weight Bearing Restrictions: No    Mobility  Bed Mobility Overal bed mobility: Needs Assistance Bed Mobility: Supine to Sit     Supine to sit: HOB elevated;Min guard        Transfers Overall transfer level: Needs assistance Equipment used: Rolling walker (2 wheeled)   Sit to Stand: Min guard         General transfer comment: assist for balance  Ambulation/Gait Ambulation/Gait assistance: Min guard;Supervision Gait Distance (Feet): 150 Feet Assistive device: Rolling walker (2 wheeled) Gait Pattern/deviations: Step-to pattern;Decreased stride length     General Gait Details: no knee buckling, no immobilizer, but did keep with step to pattern despite cues   Stairs             Wheelchair Mobility    Modified Rankin (Stroke Patients Only)       Balance Overall balance assessment: Needs assistance   Sitting balance-Leahy Scale: Good       Standing balance-Leahy Scale: Fair                               Cognition Arousal/Alertness: Awake/alert Behavior During Therapy: WFL for tasks assessed/performed Overall Cognitive Status: Within Functional Limits for tasks assessed                                        Exercises Total Joint Exercises Ankle Circles/Pumps: AROM;Both;10 reps;Supine Quad Sets: 10 reps;Supine;Left Short Arc Quad: 10 reps;AROM;Left;Supine Heel Slides: AROM;AAROM;10 reps;Left;Supine Hip ABduction/ADduction: AROM;10 reps;Left;Supine Straight Leg Raises: AAROM;AROM;10 reps;Supine;Left Goniometric ROM: about 0-80    General Comments        Pertinent Vitals/Pain Pain Score: 7  Pain Location: L knee with exercise Pain Descriptors / Indicators: Grimacing;Operative site guarding Pain Intervention(s): RN gave pain meds during session;Ice applied    Home Living                      Prior Function            PT Goals (current goals can now be found in the care plan section) Progress towards PT goals: Progressing toward goals    Frequency    7X/week      PT Plan Current plan remains appropriate    Co-evaluation              AM-PAC PT "6 Clicks" Daily Activity  Outcome Measure  Difficulty turning over in bed (including adjusting bedclothes,  sheets and blankets)?: A Little Difficulty moving from lying on back to sitting on the side of the bed? : Unable Difficulty sitting down on and standing up from a chair with arms (e.g., wheelchair, bedside commode, etc,.)?: Unable Help needed moving to and from a bed to chair (including a wheelchair)?: A Little Help needed walking in hospital room?: A Little Help needed climbing 3-5 steps with a railing? : A Little 6 Click Score: 14    End of Session Equipment Utilized During Treatment: Gait belt Activity Tolerance: Patient tolerated treatment well Patient left: with call bell/phone within reach;in chair   PT Visit Diagnosis: Other abnormalities of gait and mobility  (R26.89);Difficulty in walking, not elsewhere classified (R26.2)     Time: 1324-4010 PT Time Calculation (min) (ACUTE ONLY): 37 min  Charges:  $Gait Training: 8-22 mins $Therapeutic Exercise: 8-22 mins                     [place], [place] [phone] 05/21/2018    Elray Mcgregor 05/21/2018, 10:56 AM

## 2018-05-21 NOTE — Progress Notes (Signed)
Patient ID: Jocelyn Wiley, female   DOB: 07-31-1951, 67 y.o.   MRN: [id number] Subjective: 1 Day Post-Op Procedure(s) (LRB): LEFT TOTAL KNEE ARTHROPLASTY (Left) Patient reports pain as mild.   Seen by myself and Dr. Shelle Iron in rounds. Patient has complaints of L knee pain. No other c/o.  We will start therapy today. Plan is to go home after hospital stay.  Objective: Vital signs in last 24 hours: Temp:  [96 F (35.6 C)-98.2 F (36.8 C)] 98.2 F (36.8 C) (09/05 0518) Pulse Rate:  [62-77] 65 (09/05 0518) Resp:  [11-18] 15 (09/05 0121) BP: (151-188)/(63-90) 151/63 (09/05 0518) SpO2:  [100 %] 100 % (09/05 0518)  Intake/Output from previous day:  Intake/Output Summary (Last 24 hours) at 05/21/2018 1128 Last data filed at 05/21/2018 1056 Gross per 24 hour  Intake 4265.98 ml  Output 2225 ml  Net 2040.98 ml    Intake/Output this shift: Total I/O In: -  Out: 400 [Urine:400]  Labs: Results for orders placed or performed during the hospital encounter of 05/20/18  CBC  Result Value Ref Range   WBC 7.3 4.0 - 10.5 K/uL   RBC 3.67 (L) 3.87 - 5.11 MIL/uL   Hemoglobin 10.4 (L) 12.0 - 15.0 g/dL   HCT 40.9 (L) 81.1 - 91.4 %   MCV 84.5 78.0 - 100.0 fL   MCH 28.3 26.0 - 34.0 pg   MCHC 33.5 30.0 - 36.0 g/dL   RDW 78.2 95.6 - 21.3 %   Platelets 195 150 - 400 K/uL  Basic metabolic panel  Result Value Ref Range   Sodium 139 135 - 145 mmol/L   Potassium 3.9 3.5 - 5.1 mmol/L   Chloride 99 98 - 111 mmol/L   CO2 29 22 - 32 mmol/L   Glucose, Bld 158 (H) 70 - 99 mg/dL   BUN 10 8 - 23 mg/dL   Creatinine, Ser 0.86 (H) 0.44 - 1.00 mg/dL   Calcium 8.5 (L) 8.9 - 10.3 mg/dL   GFR calc non Af Amer 53 (L) >60 mL/min   GFR calc Af Amer >60 >60 mL/min   Anion gap 11 5 - 15    Exam - Neurologically intact ABD soft Neurovascular intact Sensation intact distally Intact pulses distally Dorsiflexion/Plantar flexion intact Incision: dressing C/D/I and no drainage No cellulitis present Compartment  soft no calf pain or sign of DVT Dressing - clean, dry, no drainage Motor function intact - moving foot and toes well on exam.   Assessment/Plan: 1 Day Post-Op Procedure(s) (LRB): LEFT TOTAL KNEE ARTHROPLASTY (Left)  Advance diet Up with therapy D/C IV fluids Past Medical History:  Diagnosis Date  . Anemia   . Anxiety   . Asthma    seasonal  . CKD (chronic kidney disease), stage I   . DDD (degenerative disc disease), lumbar   . 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 Glaucoma   . Heart murmur   . Hypertension   . Hypothyroidism   . Lumbar disc herniation   . Migraines   . OA (osteoarthritis)   . Polymyalgia rheumatica (HCC)   . PONV (postoperative nausea and vomiting)   . Shingles 2018   current 05/20/18 right leg no ulcerations    DVT Prophylaxis - ASA Protocol Weight-Bearing as tolerated to Left leg No vaccines. Plan D/C home when ready, Fri vs Sat depending on pain and progress with PT Plan to start outpt PT Monday (already scheduled at our office)   BISSELL, JACLYN M.  05/21/2018, 11:28 AM

## 2018-05-22 LAB — CBC
HCT: 33.4 % — ABNORMAL LOW (ref 36.0–46.0)
Hemoglobin: 10.8 g/dL — ABNORMAL LOW (ref 12.0–15.0)
MCH: 27.8 pg (ref 26.0–34.0)
MCHC: 32.3 g/dL (ref 30.0–36.0)
MCV: 85.9 fL (ref 78.0–100.0)
PLATELETS: 255 10*3/uL (ref 150–400)
RBC: 3.89 MIL/uL (ref 3.87–5.11)
RDW: 13.9 % (ref 11.5–15.5)
WBC: 8.1 10*3/uL (ref 4.0–10.5)

## 2018-05-22 MED ORDER — HYDROMORPHONE HCL 2 MG PO TABS: 2.0000 mg | ORAL_TABLET | ORAL | 0 refills | Status: DC | PRN

## 2018-05-22 MED ORDER — GABAPENTIN 300 MG PO CAPS: 300.0000 mg | ORAL_CAPSULE | Freq: Three times a day (TID) | ORAL | 2 refills | Status: DC

## 2018-05-22 MED ORDER — HYDROMORPHONE HCL 2 MG PO TABS
2.0000 mg | ORAL_TABLET | ORAL | Status: DC | PRN
  Administered 2018-05-22 (×2): 2 mg via ORAL
  Filled 2018-05-22 (×3): qty 1

## 2018-05-22 MED ORDER — GABAPENTIN 300 MG PO CAPS
300.0000 mg | ORAL_CAPSULE | Freq: Three times a day (TID) | ORAL | Status: DC
  Administered 2018-05-22 – 2018-05-23 (×4): 300 mg via ORAL
  Filled 2018-05-22 (×4): qty 1

## 2018-05-22 NOTE — Progress Notes (Signed)
Physical Therapy Treatment Patient Details Name: Jocelyn Wiley MRN: [id number] DOB: 14-Sep-1951 Today's Date: 05/22/2018    History of Present Illness Pt is a 67 YO female s/p L TKR on 9/4. PMH includes anemia, anxiety, asthma, CKD I, DDD, DJD, glaucoma, heart murmur, hypothyroidism, lumbar disc herniation, migraines, OA. Surgical history includes R TKR, back surgery, partial thyroidectomy.     PT Comments    Patient with limited participation (one session due to pain) and increased pain, edema and limited ROM in L knee today.  Ambulation limited by fatigue.  Reports difficulty getting comfortable overnight so little rest.  Will continue PT prior to d/c home with family support.   Follow Up Recommendations  Follow surgeon's recommendation for DC plan and follow-up therapies;Supervision for mobility/OOB     Equipment Recommendations  None recommended by PT    Recommendations for Other Services       Precautions / Restrictions Precautions Precautions: Fall Restrictions Other Position/Activity Restrictions: WBAT     Mobility  Bed Mobility   Bed Mobility: Sit to Supine     Supine to sit: Supervision;HOB elevated        Transfers Overall transfer level: Needs assistance Equipment used: Rolling walker (2 wheeled) Transfers: Sit to/from Stand Sit to Stand: Min guard         General transfer comment: increased time, decreased L LE flexion in sitting  Ambulation/Gait Ambulation/Gait assistance: Supervision;Min guard Gait Distance (Feet): 75 Feet Assistive device: Rolling walker (2 wheeled) Gait Pattern/deviations: Step-to pattern;Decreased stride length;Antalgic     General Gait Details: decreased stride length, antalgic on L   Stairs             Wheelchair Mobility    Modified Rankin (Stroke Patients Only)       Balance Overall balance assessment: Needs assistance   Sitting balance-Leahy Scale: Good       Standing balance-Leahy Scale:  Fair Standing balance comment: washed hands at sink, brushed teeth, min A given when tiipped head back to gargle due to posterior LOB                            Cognition Arousal/Alertness: Awake/alert Behavior During Therapy: WFL for tasks assessed/performed Overall Cognitive Status: Within Functional Limits for tasks assessed                                        Exercises Total Joint Exercises Ankle Circles/Pumps: AROM;Both;10 reps;Supine Quad Sets: 10 reps;Supine;Left Short Arc Quad: 10 reps;AROM;Left;Supine Heel Slides: 10 reps;Seated;Left;AAROM Hip ABduction/ADduction: AROM;10 reps;Left;Supine Straight Leg Raises: AAROM;10 reps;Supine;Left Goniometric ROM: about 10-45    General Comments        Pertinent Vitals/Pain Pain Assessment: No/denies pain    Home Living                      Prior Function            PT Goals (current goals can now be found in the care plan section) Progress towards PT goals: Not progressing toward goals - comment(due to soreness, stiffness)    Frequency    7X/week      PT Plan Current plan remains appropriate    Co-evaluation              AM-PAC PT "6 Clicks" Daily Activity  Outcome Measure  Difficulty turning  over in bed (including adjusting bedclothes, sheets and blankets)?: A Little Difficulty moving from lying on back to sitting on the side of the bed? : A Little Difficulty sitting down on and standing up from a chair with arms (e.g., wheelchair, bedside commode, etc,.)?: Unable Help needed moving to and from a bed to chair (including a wheelchair)?: A Little Help needed walking in hospital room?: A Little Help needed climbing 3-5 steps with a railing? : A Little 6 Click Score: 16    End of Session Equipment Utilized During Treatment: Gait belt Activity Tolerance: Patient limited by pain;Patient limited by fatigue Patient left: in chair;with call bell/phone within reach;with  chair alarm set   PT Visit Diagnosis: Other abnormalities of gait and mobility (R26.89);Difficulty in walking, not elsewhere classified (R26.2)     Time: 0623-7628 PT Time Calculation (min) (ACUTE ONLY): 32 min  Charges:  $Gait Training: 8-22 mins $Therapeutic Exercise: 8-22 mins                     Jocelyn Wiley, [place] [phone] 05/22/2018    Jocelyn Wiley 05/22/2018, 3:14 PM

## 2018-05-22 NOTE — Progress Notes (Addendum)
Patient ID: Jocelyn Wiley, female   DOB: [birth year], 67 y.o.   MRN: [id number] Subjective: 2 Days Post-Op Procedure(s) (LRB): LEFT TOTAL KNEE ARTHROPLASTY (Left) Patient reports pain as moderate.    Patient has complaints of L knee pain. Pain worse than yesterday. No numbness or tingling. Feels like increased rheumatoid pain. No other c/o. Does not feel ready to go home today.  Objective: Vital signs in last 24 hours: Temp:  [97.9 F (36.6 C)-99.2 F (37.3 C)] 99.2 F (37.3 C) (09/06 0534) Pulse Rate:  [71-76] 71 (09/06 0534) Resp:  [16-18] 16 (09/06 0534) BP: (143-165)/(61-65) 165/61 (09/06 0534) SpO2:  [96 %-100 %] 96 % (09/06 0534)  Intake/Output from previous day:  Intake/Output Summary (Last 24 hours) at 05/22/2018 0823 Last data filed at 05/22/2018 0540 Gross per 24 hour  Intake 553.5 ml  Output 2300 ml  Net -1746.5 ml    Intake/Output this shift: No intake/output data recorded.  Labs: Results for orders placed or performed during the hospital encounter of 05/20/18  CBC  Result Value Ref Range   WBC 7.3 4.0 - 10.5 K/uL   RBC 3.67 (L) 3.87 - 5.11 MIL/uL   Hemoglobin 10.4 (L) 12.0 - 15.0 g/dL   HCT 86.5 (L) 78.4 - 69.6 %   MCV 84.5 78.0 - 100.0 fL   MCH 28.3 26.0 - 34.0 pg   MCHC 33.5 30.0 - 36.0 g/dL   RDW 29.5 28.4 - 13.2 %   Platelets 195 150 - 400 K/uL  Basic metabolic panel  Result Value Ref Range   Sodium 139 135 - 145 mmol/L   Potassium 3.9 3.5 - 5.1 mmol/L   Chloride 99 98 - 111 mmol/L   CO2 29 22 - 32 mmol/L   Glucose, Bld 158 (H) 70 - 99 mg/dL   BUN 10 8 - 23 mg/dL   Creatinine, Ser 4.40 (H) 0.44 - 1.00 mg/dL   Calcium 8.5 (L) 8.9 - 10.3 mg/dL   GFR calc non Af Amer 53 (L) >60 mL/min   GFR calc Af Amer >60 >60 mL/min   Anion gap 11 5 - 15  CBC  Result Value Ref Range   WBC 8.1 4.0 - 10.5 K/uL   RBC 3.89 3.87 - 5.11 MIL/uL   Hemoglobin 10.8 (L) 12.0 - 15.0 g/dL   HCT 10.2 (L) 72.5 - 36.6 %   MCV 85.9 78.0 - 100.0 fL   MCH 27.8 26.0 - 34.0 pg    MCHC 32.3 30.0 - 36.0 g/dL   RDW 44.0 34.7 - 42.5 %   Platelets 255 150 - 400 K/uL    Exam - Neurologically intact ABD soft Neurovascular intact Sensation intact distally Intact pulses distally Dorsiflexion/Plantar flexion intact Incision: dressing C/D/I and no drainage No cellulitis present Compartment soft no calf pain or sign of DVT Dressing/Incision - clean, dry, no drainage Motor function intact - moving foot and toes well on exam.   Assessment/Plan: 2 Days Post-Op Procedure(s) (LRB): LEFT TOTAL KNEE ARTHROPLASTY (Left)  Advance diet Up with therapy D/C IV fluids Past Medical History:  Diagnosis Date  . Anemia   . Anxiety   . Asthma    seasonal  . CKD (chronic kidney disease), stage I   . DDD (degenerative disc disease), lumbar   . 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 Glaucoma   . Heart murmur   . Hypertension   . Hypothyroidism   . Lumbar disc herniation   . Migraines   .  OA (osteoarthritis)   . Polymyalgia rheumatica (HCC)   . PONV (postoperative nausea and vomiting)   . Shingles 2018   current 05/20/18 right leg no ulcerations    DVT Prophylaxis - ASA Protocol Weight-Bearing as tolerated to Left leg Will increase gabapentin from 100mg  TID to 300mg  TID for pain control Discussed with Amy, pt's nurse. Norco does not seem to be controlling pain. Cannot take Oxy. Tolerating IV dilaudid well but is lethargic with it. Will try low dose of PO dilaudid for pain control to see if more effective for her than norco.  IV dilaudid sparingly for severe breakthrough pain only through today Plan D/C home tomorrow with outpt PT to start Monday Will discuss with Dr. Elissa Lovett, Dayna Barker. 05/22/2018, 8:23 AM

## 2018-05-22 NOTE — Discharge Summary (Signed)
Physician Discharge Summary   Patient ID: Jocelyn Wiley MRN: [id number] DOB/AGE: Jun 01, 1951 67 y.o.  Admit date: 05/20/2018 Discharge date: 05/23/18  Primary Diagnosis: Left knee primary osteoarthritis  Admission Diagnoses:  Past Medical History:  Diagnosis Date  . Anemia   . Anxiety   . Asthma    seasonal  . CKD (chronic kidney disease), stage I   . DDD (degenerative disc disease), lumbar   . 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 Glaucoma   . Heart murmur   . Hypertension   . Hypothyroidism   . Lumbar disc herniation   . Migraines   . OA (osteoarthritis)   . Polymyalgia rheumatica ([place])   . PONV (postoperative nausea and vomiting)   . Shingles 2018   current 05/20/18 right leg no ulcerations   Discharge Diagnoses:   Principal Problem:   Primary osteoarthritis of left knee Active Problems:   Left knee DJD  Estimated body mass index is 29.02 kg/m as calculated from the following:   Height as of this encounter: '5\' 4"'$  (1.626 m).   Weight as of this encounter: 76.7 kg.  Procedure:  Procedure(s) (LRB): LEFT TOTAL KNEE ARTHROPLASTY (Left)   Consults: None  HPI: see H&P Laboratory Data: Admission on 05/20/2018  Component Date Value Ref Range Status  . WBC 05/21/2018 7.3  4.0 - 10.5 K/uL Final  . RBC 05/21/2018 3.67* 3.87 - 5.11 MIL/uL Final  . Hemoglobin 05/21/2018 10.4* 12.0 - 15.0 g/dL Final  . HCT 05/21/2018 31.0* 36.0 - 46.0 % Final  . MCV 05/21/2018 84.5  78.0 - 100.0 fL Final  . MCH 05/21/2018 28.3  26.0 - 34.0 pg Final  . MCHC 05/21/2018 33.5  30.0 - 36.0 g/dL Final  . RDW 05/21/2018 13.4  11.5 - 15.5 % Final  . Platelets 05/21/2018 195  150 - 400 K/uL Final   Performed at [facility], [place] [address] [place], [place] [address]  . Sodium 05/21/2018 139  135 - 145 mmol/L Final  . Potassium 05/21/2018 3.9  3.5 - 5.1 mmol/L Final  . Chloride 05/21/2018 99  98 - 111 mmol/L Final  . CO2 05/21/2018 29  22 - 32 mmol/L Final    . Glucose, Bld 05/21/2018 158* 70 - 99 mg/dL Final  . BUN 05/21/2018 10  8 - 23 mg/dL Final  . Creatinine, Ser 05/21/2018 1.06* 0.44 - 1.00 mg/dL Final  . Calcium 05/21/2018 8.5* 8.9 - 10.3 mg/dL Final  . GFR calc non Af Amer 05/21/2018 53* >60 mL/min Final  . GFR calc Af Amer 05/21/2018 >60  >60 mL/min Final   Comment: (NOTE) The eGFR has been calculated using the CKD EPI equation. This calculation has not been validated in all clinical situations. eGFR's persistently <60 mL/min signify possible Chronic Kidney Disease.   [name] gap 05/21/2018 11  5 - 15 Final   Performed at [facility], [place] [address] [place], [place] [address]  . WBC 05/22/2018 8.1  4.0 - 10.5 K/uL Final  . RBC 05/22/2018 3.89  3.87 - 5.11 MIL/uL Final  . Hemoglobin 05/22/2018 10.8* 12.0 - 15.0 g/dL Final  . HCT 05/22/2018 33.4* 36.0 - 46.0 % Final  . MCV 05/22/2018 85.9  78.0 - 100.0 fL Final  . MCH 05/22/2018 27.8  26.0 - 34.0 pg Final  . MCHC 05/22/2018 32.3  30.0 - 36.0 g/dL Final  . RDW 05/22/2018 13.9  11.5 - 15.5 % Final  . Platelets  05/22/2018 255  150 - 400 K/uL Final   Performed at [facility], [place] [address], [place], [place] [address]  Hospital Outpatient Visit on 05/12/2018  Component Date Value Ref Range Status  . aPTT 05/12/2018 28  24 - 36 seconds Final   Performed at [facility], [place] [address] [place], [place] [address]  . Sodium 05/12/2018 138  135 - 145 mmol/L Final  . Potassium 05/12/2018 4.4  3.5 - 5.1 mmol/L Final  . Chloride 05/12/2018 99  98 - 111 mmol/L Final  . CO2 05/12/2018 29  22 - 32 mmol/L Final  . Glucose, Bld 05/12/2018 82  70 - 99 mg/dL Final  . BUN 05/12/2018 18  8 - 23 mg/dL Final  . Creatinine, Ser 05/12/2018 1.22* 0.44 - 1.00 mg/dL Final  . Calcium 05/12/2018 9.9  8.9 - 10.3 mg/dL Final  . GFR calc non Af Amer 05/12/2018 44* >60 mL/min Final  . GFR calc Af Amer 05/12/2018 52* >60 mL/min Final   Comment:  (NOTE) The eGFR has been calculated using the CKD EPI equation. This calculation has not been validated in all clinical situations. eGFR's persistently <60 mL/min signify possible Chronic Kidney Disease.   [name] gap 05/12/2018 10  5 - 15 Final   Performed at [facility], [place] [address] [place], [place] [address]  . WBC 05/12/2018 5.2  4.0 - 10.5 K/uL Final  . RBC 05/12/2018 4.41  3.87 - 5.11 MIL/uL Final  . Hemoglobin 05/12/2018 12.2  12.0 - 15.0 g/dL Final  . HCT 05/12/2018 37.4  36.0 - 46.0 % Final  . MCV 05/12/2018 84.8  78.0 - 100.0 fL Final  . MCH 05/12/2018 27.7  26.0 - 34.0 pg Final  . MCHC 05/12/2018 32.6  30.0 - 36.0 g/dL Final  . RDW 05/12/2018 13.6  11.5 - 15.5 % Final  . Platelets 05/12/2018 266  150 - 400 K/uL Final   Performed at [facility], [place] [address], [place], [place] [address]  . Prothrombin Time 05/12/2018 12.1  11.4 - 15.2 seconds Final  . INR 05/12/2018 0.91   Final   Performed at [facility], [place] [address] [place], [place] [address]  . Color, Urine 05/12/2018 YELLOW  YELLOW Final  . APPearance 05/12/2018 CLEAR  CLEAR Final  . Specific Gravity, Urine 05/12/2018 1.004* 1.005 - 1.030 Final  . pH 05/12/2018 8.0  5.0 - 8.0 Final  . Glucose, UA 05/12/2018 NEGATIVE  NEGATIVE mg/dL Final  . Hgb urine dipstick 05/12/2018 NEGATIVE  NEGATIVE Final  . Bilirubin Urine 05/12/2018 NEGATIVE  NEGATIVE Final  . Ketones, ur 05/12/2018 NEGATIVE  NEGATIVE mg/dL Final  . Protein, ur 05/12/2018 NEGATIVE  NEGATIVE mg/dL Final  . Nitrite 05/12/2018 NEGATIVE  NEGATIVE Final  . Leukocytes, UA 05/12/2018 NEGATIVE  NEGATIVE Final   Performed at [place] [address] [place], [place] [address]  . MRSA, PCR 05/12/2018 NEGATIVE  NEGATIVE Final  . Staphylococcus aureus 05/12/2018 NEGATIVE  NEGATIVE Final   Comment: (NOTE) The Xpert SA Assay (FDA approved for NASAL specimens in patients 48 years  of age and older), is one component of a comprehensive surveillance program. It is not intended to diagnose infection nor to guide or monitor treatment. Performed at [facility], [place] [address], [place], [place] [address]      X-Rays:Dg Chest 2 View  Result Date: 05/12/2018 CLINICAL DATA:  Preop left knee replacement. EXAM: CHEST -  2 VIEW COMPARISON:  11/15/2014 FINDINGS: The cardiomediastinal silhouette is within normal limits. The lungs are well inflated and clear. There is no evidence of pleural effusion or pneumothorax. No acute osseous abnormality is identified. Cholecystectomy clips are noted. IMPRESSION: No active cardiopulmonary disease. Electronically Signed   By: Logan Bores M.D.   On: 05/12/2018 17:12   Dg Knee Left Port  Result Date: 05/20/2018 CLINICAL DATA:  Status post left total knee arthroplasty EXAM: PORTABLE LEFT KNEE - 1-2 VIEW COMPARISON:  None. FINDINGS: The patient has undergone left total knee arthroplasty. There is intra-articular and soft tissue gas. Arthroplasty components are well seated and in normal alignment. IMPRESSION: Expected postoperative appearance of left total knee arthroplasty. Electronically Signed   By: Ulyses Jarred M.D.   On: 05/20/2018 15:08    EKG: Orders placed or performed during the hospital encounter of 05/12/18  . EKG 12 lead  . EKG 12 lead     Hospital Course: Jocelyn Wiley is a 67 y.o. who was admitted to [facility]. They were brought to the operating room on 05/20/2018 and underwent Procedure(s): LEFT TOTAL KNEE ARTHROPLASTY.  Patient tolerated the procedure well and was later transferred to the recovery room and then to the orthopaedic floor for postoperative care.  They were given PO and IV analgesics for pain control following their surgery.  They were given 24 hours of postoperative antibiotics of  Anti-infectives (From admission, onward)   Start     Dose/Rate Route Frequency Ordered Stop   05/20/18  1800  ceFAZolin (ANCEF) IVPB 2g/100 mL premix     2 g 200 mL/hr over 30 Minutes Intravenous Every 6 hours 05/20/18 1550 05/21/18 0547   05/20/18 1600  valACYclovir (VALTREX) tablet 1,000 mg  Status:  Discontinued    Note to Pharmacy:  For 7 days starting 05/14/2018     1,000 mg Oral 3 times daily 05/20/18 1551 05/20/18 1613   05/20/18 1208  polymyxin B 500,000 Units, bacitracin 50,000 Units in sodium chloride 0.9 % 500 mL irrigation  Status:  Discontinued       As needed 05/20/18 1208 05/20/18 1328   05/20/18 0900  ceFAZolin (ANCEF) IVPB 2g/100 mL premix     2 g 200 mL/hr over 30 Minutes Intravenous On call to O.R. 05/20/18 9163 05/20/18 1125     and started on DVT prophylaxis in the form of Aspirin, TED hose and SCDs.   PT and OT were ordered for total joint protocol.  Discharge planning consulted to help with postop disposition and equipment needs.  Patient had a good night on the evening of surgery.  They started to get up OOB with therapy on day one. Continued to work with therapy into day two.  By day three, the patient had progressed with therapy and meeting their goals.  Incision was healing well.  Patient was seen in rounds and was ready to go home.   Diet: Regular diet Activity:WBAT Follow-up:in 10-14 days Disposition - Home Discharged Condition: good    Allergies as of 05/22/2018      Reactions   Hydrocodone Nausea And Vomiting   Caffeine Palpitations      Medication List    STOP taking these medications   COD LIVER PO     TAKE these medications   aspirin 325 MG EC tablet Take 1 tablet (325 mg total) by mouth 2 (two) times daily with a meal.   docusate sodium 100 MG capsule Commonly known as:  COLACE Take 1  capsule (100 mg total) by mouth 2 (two) times daily.   gabapentin 300 MG capsule Commonly known as:  NEURONTIN Take 1 capsule (300 mg total) by mouth 3 (three) times daily. What changed:    medication strength  how much to take   HYDROmorphone 2 MG  tablet Commonly known as:  DILAUDID Take 1-2 tablets (2-4 mg total) by mouth every 4 (four) hours as needed for severe pain.   ipratropium 0.06 % nasal spray Commonly known as:  ATROVENT Place 2 sprays into both nostrils 4 (four) times daily as needed for allergies.   levothyroxine 75 MCG tablet Commonly known as:  SYNTHROID, LEVOTHROID Take 75 mcg by mouth daily before breakfast.   metoprolol succinate 25 MG 24 hr tablet Commonly known as:  TOPROL-XL Take 25 mg by mouth daily.   ondansetron 4 MG tablet Commonly known as:  ZOFRAN Take 1 tablet (4 mg total) by mouth every 6 (six) hours as needed for nausea or vomiting.   polyethylene glycol packet Commonly known as:  MIRALAX / GLYCOLAX Take 17 g by mouth daily as needed for mild constipation.   PREMARIN 0.9 MG tablet Generic drug:  estrogens (conjugated) Take 0.9 mg by mouth daily.   PROAIR HFA 108 (90 Base) MCG/ACT inhaler Generic drug:  albuterol Inhale 2 puffs into the lungs every 4 (four) hours as needed. For seasonal asthma attacks due to [facility] weather.   timolol 0.5 % ophthalmic solution Commonly known as:  TIMOPTIC Place 1 drop into both eyes 2 (two) times daily.   topiramate 25 MG tablet Commonly known as:  TOPAMAX Take 25 mg by mouth daily as needed (for migraines).   traMADol 50 MG tablet Commonly known as:  ULTRAM Take 1 tablet (50 mg total) by mouth every 6 (six) hours as needed for moderate pain (pain.). What changed:    when to take this  reasons to take this   triamterene-hydrochlorothiazide 37.5-25 MG tablet Commonly known as:  MAXZIDE-25 Take 1 tablet by mouth daily.   valACYclovir 1000 MG tablet Commonly known as:  VALTREX Take 1 g by mouth 3 (three) times daily. For 7 days starting 05/14/2018   vitamin B-12 500 MCG tablet Commonly known as:  CYANOCOBALAMIN Take 500 mcg by mouth daily.   vitamin C 500 MG tablet Commonly known as:  ASCORBIC ACID Take 500 mg by mouth daily.       Follow-up Information    Susa Day, MD Follow up in 2 week(s).   Specialty:  Orthopedic Surgery Contact information: [address] [place] [address] [phone]           Signed: Lacie Draft, PA-C Orthopaedic Surgery 05/22/2018, 4:54 PM

## 2018-05-23 LAB — CBC
HEMATOCRIT: 31.1 % — AB (ref 36.0–46.0)
HEMOGLOBIN: 10.2 g/dL — AB (ref 12.0–15.0)
MCH: 27.7 pg (ref 26.0–34.0)
MCHC: 32.8 g/dL (ref 30.0–36.0)
MCV: 84.5 fL (ref 78.0–100.0)
Platelets: 212 10*3/uL (ref 150–400)
RBC: 3.68 MIL/uL — AB (ref 3.87–5.11)
RDW: 13.6 % (ref 11.5–15.5)
WBC: 6.9 10*3/uL (ref 4.0–10.5)

## 2018-05-23 NOTE — Progress Notes (Signed)
Physical Therapy Treatment Patient Details Name: Jocelyn Wiley MRN: [id number] DOB: 1951/08/02 Today's Date: 05/23/2018    History of Present Illness Pt is a 67 YO female s/p L TKR on 9/4. PMH includes anemia, anxiety, asthma, CKD I, DDD, DJD, glaucoma, heart murmur, hypothyroidism, lumbar disc herniation, migraines, OA. Surgical history includes R TKR, back surgery, partial thyroidectomy.     PT Comments    Progressing well with mobility. Reviewed/practiced exercises and gait training. Pt did not feel she needed to practice stair negotiation again. Issued HEP for pt to perform 2x/day until she begins OP PT. All education completed. Okay to d/c from PT standpoint-made Rn aware.    Follow Up Recommendations  Follow surgeon's recommendation for DC plan and follow-up therapies;Supervision for mobility/OOB     Equipment Recommendations  None recommended by PT    Recommendations for Other Services       Precautions / Restrictions Precautions Precautions: Fall Restrictions Weight Bearing Restrictions: No Other Position/Activity Restrictions: WBAT     Mobility  Bed Mobility               General bed mobility comments: oob in recliner  Transfers Overall transfer level: Needs assistance Equipment used: Rolling walker (2 wheeled) Transfers: Sit to/from Stand Sit to Stand: Supervision         General transfer comment: Increased time.   Ambulation/Gait Ambulation/Gait assistance: Supervision Gait Distance (Feet): 125 Feet Assistive device: Rolling walker (2 wheeled) Gait Pattern/deviations: Step-to pattern;Decreased stride length     General Gait Details: slow gati speed.    Stairs             Wheelchair Mobility    Modified Rankin (Stroke Patients Only)       Balance Overall balance assessment: Mild deficits observed, not formally tested                                          Cognition Arousal/Alertness:  Awake/alert Behavior During Therapy: WFL for tasks assessed/performed Overall Cognitive Status: Within Functional Limits for tasks assessed                                        Exercises Total Joint Exercises Ankle Circles/Pumps: AROM;Both;10 reps;Supine Quad Sets: 10 reps;Supine;AROM;Both Heel Slides: 10 reps;Seated;Left;AAROM Hip ABduction/ADduction: AROM;10 reps;Left;Supine Straight Leg Raises: AAROM;10 reps;Supine;Left Goniometric ROM: ~5-70 degrees    General Comments        Pertinent Vitals/Pain Pain Assessment: 0-10 Pain Score: 4  Pain Location: L knee  Pain Descriptors / Indicators: Sore;Tightness;Aching Pain Intervention(s): Monitored during session;Repositioned;Ice applied    Home Living                      Prior Function            PT Goals (current goals can now be found in the care plan section) Progress towards PT goals: Progressing toward goals    Frequency    7X/week      PT Plan Current plan remains appropriate    Co-evaluation              AM-PAC PT "6 Clicks" Daily Activity  Outcome Measure  Difficulty turning over in bed (including adjusting bedclothes, sheets and blankets)?: A Little Difficulty moving from lying on back to sitting  on the side of the bed? : A Little Difficulty sitting down on and standing up from a chair with arms (e.g., wheelchair, bedside commode, etc,.)?: A Little Help needed moving to and from a bed to chair (including a wheelchair)?: A Little Help needed walking in hospital room?: A Little Help needed climbing 3-5 steps with a railing? : A Little 6 Click Score: 18    End of Session Equipment Utilized During Treatment: Gait belt Activity Tolerance: Patient tolerated treatment well Patient left: in chair;with call bell/phone within reach   PT Visit Diagnosis: Other abnormalities of gait and mobility (R26.89);Difficulty in walking, not elsewhere classified (R26.2)     Time:  4098-1191 PT Time Calculation (min) (ACUTE ONLY): 25 min  Charges:  $Gait Training: 8-22 mins $Therapeutic Exercise: 8-22 mins                        Rebeca Alert, MPT Pager: [phone]

## 2018-05-23 NOTE — Progress Notes (Signed)
     Subjective: 3 Days Post-Op Procedure(s) (LRB): LEFT TOTAL KNEE ARTHROPLASTY (Left)   Patient reports pain as mild, pain controlled. No events throughout the night. She states that she is feeling better.  Ready to be discharged home.     Objective:   VITALS:   Vitals:   05/22/18 1421 05/22/18 2205  BP: (!) 149/60 (!) 154/63  Pulse: 72 85  Resp: 15 16  Temp: 98.2 F (36.8 C) 98.6 F (37 C)  SpO2: 100% 100%    Dorsiflexion/Plantar flexion intact Incision: dressing C/D/I No cellulitis present Compartment soft  LABS Recent Labs    05/21/18 0546 05/22/18 0509 05/23/18 0407  HGB 10.4* 10.8* 10.2*  HCT 31.0* 33.4* 31.1*  WBC 7.3 8.1 6.9  PLT 195 255 212    Recent Labs    05/21/18 0546  NA 139  K 3.9  BUN 10  CREATININE 1.06*  GLUCOSE 158*     Assessment/Plan: 3 Days Post-Op Procedure(s) (LRB): LEFT TOTAL KNEE ARTHROPLASTY (Left) Up with therapy Discharge home Follow up in 2 weeks at [facility] Follow up with Dr Shelle Iron in 2 weeks.  Contact information:  [facility]) [address] [place] [address] [phone]        Anastasio Auerbach. Merwyn Hodapp   PAC  05/23/2018, 8:33 AM

## 2018-05-23 NOTE — Care Management Note (Signed)
Case Management Note  Patient Details  Name: BREONAH RUGGLES MRN: [id number] Date of Birth: June 05, 1951  Subjective/Objective:   Left TKA                 Action/Plan: NCM spoke to pt at bedside. Pt states she is scheduled for OPPT at [facility] on Monday. She has RW at home. Requested 3n1 bedside commode for home. Contacted [facility] for delivery of 3n1 to room prior to dc.   Expected Discharge Date:  05/23/18               Expected Discharge Plan:  OP Rehab  In-House Referral:  NA  Discharge planning Services  CM Consult  Post Acute Care Choice:  NA Choice offered to:  NA  DME Arranged:  3-N-1 DME Agency:  [facility]  HH Arranged:  NA HH Agency:  NA  Status of Service:  Completed, signed off  If discussed at Long Length of Stay Meetings, dates discussed:    Additional Comments:  Elliot Cousin, RN 05/23/2018, 11:04 AM

## 2018-05-23 NOTE — Care Management Important Message (Signed)
Important Message  Patient Details  Name: Jocelyn Wiley MRN: [id number] Date of Birth: 03-16-1951   Medicare Important Message Given:  Yes    Elliot Cousin, RN 05/23/2018, 10:53 AM

## 2018-05-23 NOTE — Plan of Care (Signed)
Pt to d/c home today with home equipment. Pt stable this am.

## 2018-05-23 NOTE — Anesthesia Postprocedure Evaluation (Signed)
Anesthesia Post Note  Patient: Jocelyn Wiley  Procedure(s) Performed: LEFT TOTAL KNEE ARTHROPLASTY (Left Knee)     Patient location during evaluation: PACU Anesthesia Type: General Level of consciousness: awake Pain management: pain level not controlled Vital Signs Assessment: post-procedure vital signs reviewed and stable Respiratory status: spontaneous breathing Cardiovascular status: stable Postop Assessment: no apparent nausea or vomiting Anesthetic complications: no    Last Vitals:  Vitals:   05/22/18 1421 05/22/18 2205  BP: (!) 149/60 (!) 154/63  Pulse: 72 85  Resp: 15 16  Temp: 36.8 C 37 C  SpO2: 100% 100%    Last Pain:  Vitals:   05/23/18 0942  TempSrc:   PainSc: 1    Pain Goal: Patients Stated Pain Goal: 3 (05/22/18 1714)               Avionna Bower JR,JOHN Susann Givens

## 2018-05-23 NOTE — Progress Notes (Signed)
Pt d/c home with family in stable condition. Pt was able tolerate walking well with walker in hall and in room. Pt equipment sent with patient. No questions on d/c instructions and education. No needs at time of d/c.

## 2018-06-10 NOTE — Addendum Note (Signed)
Addendum  created 06/10/18 1000 by Marcene DuosFitzgerald, Marciana Uplinger, MD   Intraprocedure Blocks edited, Sign clinical note

## 2018-09-11 ENCOUNTER — Ambulatory Visit: Payer: Self-pay | Admitting: Orthopedic Surgery

## 2018-09-15 ENCOUNTER — Ambulatory Visit: Payer: Self-pay | Admitting: Orthopedic Surgery

## 2018-09-15 NOTE — H&P (View-Only) (Signed)
Jocelyn Wiley is an 67 y.o. female.   Chief Complaint: back and L leg pain HPI: Reported by patient. Reason for Visit: (normal) review of test results (lumbar MRI); (normal) visit for: (back); The patient is 11 weeks out from when symptoms began. Severity: pain level 5/10 Timing: come and go Aggravating Factors: standing for ; sitting for ; lying down (on her left side) Associated Symptoms: numbness/tingling (left lower extremity); bilateral lower extremity pain Medications: helping a little; The patient takes Tramadol as needed and Gabapentin.  Past Medical History:  Diagnosis Date  . Anemia   . Anxiety   . Asthma    seasonal  . CKD (chronic kidney disease), stage I   . DDD (degenerative disc disease), lumbar   . 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 Glaucoma   . Heart murmur   . Hypertension   . Hypothyroidism   . Lumbar disc herniation   . Migraines   . OA (osteoarthritis)   . Polymyalgia rheumatica (HCC)   . PONV (postoperative nausea and vomiting)   . Shingles 2018   current 05/20/18 right leg no ulcerations    Past Surgical History:  Procedure Laterality Date  . APPENDECTOMY    . BACK SURGERY    . CHOLECYSTECTOMY    . COLONOSCOPY    . THYROIDECTOMY, PARTIAL    . TONSILLECTOMY    . TOTAL ABDOMINAL HYSTERECTOMY    . TOTAL KNEE ARTHROPLASTY Right   . TOTAL KNEE ARTHROPLASTY Left 05/20/2018   Procedure: LEFT TOTAL KNEE ARTHROPLASTY;  Surgeon: Beane, Jeffrey, MD;  Location: [facility];  Service: Orthopedics;  Laterality: Left;  120 mins    No family history on file. Social History:  reports that she has never smoked. She has never used smokeless tobacco. She reports current alcohol use. She reports that she does not use drugs.  Allergies:  Allergies  Allergen Reactions  . Hydrocodone Nausea And Vomiting  . Caffeine Palpitations   Medications: ascorbic acid (vitamin C) 500 mg tablet cyanocobalamin (vit B-12) 500 mcg tablet docusate sodium 100 mg  capsule Ecotrin 325 mg tablet,enteric coated fluticasone propionate 50 mcg/actuation nasal spray,suspension levothyroxine 75 mcg tablet loratadine 10 mg tablet metoprolol succinate ER 25 mg tablet,extended release 24 hr Mobic 15 mg tablet Neurontin 300 mg capsule ondansetron HCL 4 mg tablet polyethylene glycoL 3350 17 gram oral powder packet Premarin 0.9 mg tablet ProAir HFA 90 mcg/actuation aerosol inhaler timolol 0.5 % eye drops topiramate 25 mg tablet traMADoL 50 mg tablet traZODone valACYclovir 1 gram tablet valsartan 320 mg tablet  Review of Systems  Constitutional: Negative.   HENT: Negative.   Eyes: Negative.   Respiratory: Negative.   Cardiovascular: Negative.   Gastrointestinal: Negative.   Genitourinary: Negative.   Musculoskeletal: Positive for back pain.  Skin: Negative.   Neurological: Positive for sensory change and focal weakness.  Psychiatric/Behavioral: Negative.     There were no vitals taken for this visit. Physical Exam  Constitutional: She is oriented to person, place, and time. She appears well-developed and well-nourished. She appears distressed.  HENT:  Head: Normocephalic.  Eyes: Pupils are equal, round, and reactive to light.  Neck: Normal range of motion.  Cardiovascular: Normal rate.  Respiratory: Effort normal.  GI: Soft.  Musculoskeletal:     Comments: Patient is a 67-year-old female.  Gait and Station: Appearance: ambulating with no assistive devices and antalgic gait.  Constitutional: General Appearance: healthy-appearing and distress (mild).  Psychiatric: Mood and Affect: active and   alert.  Cardiovascular System: Edema Right: none; Dorsalis and posterior tibial pulses 2+. Edema Left: none.  Abdomen: Inspection and Palpation: non-distended and no tenderness.  Skin: Inspection and palpation: no rash.  Lumbar Spine: Inspection: normal alignment. Bony Palpation of the Lumbar Spine: tender at lumbosacral junction.. Bony Palpation  of the Right Hip: no tenderness of the greater trochanter and tenderness of the SI joint; Pelvis stable. Bony Palpation of the Left Hip: no tenderness of the greater trochanter and tenderness of the SI joint. Soft Tissue Palpation on the Right: No flank pain with percussion. Active Range of Motion: limited flexion and extention.  Motor Strength: L1 Motor Strength on the Right: hip flexion iliopsoas 5/5. L1 Motor Strength on the Left: hip flexion iliopsoas 5/5. L2-L4 Motor Strength on the Right: knee extension quadriceps 5/5. L2-L4 Motor Strength on the Left: knee extension quadriceps 5/5. L5 Motor Strength on the Right: ankle dorsiflexion tibialis anterior 5/5 and great toe extension extensor hallucis longus 5/5. L5 Motor Strength on the Left: ankle dorsiflexion tibialis anterior 5/5 and great toe extension extensor hallucis longus 5/5. S1 Motor Strength on the Right: plantar flexion gastrocnemius 5/5. S1 Motor Strength on the Left: plantar flexion gastrocnemius 5/5.  Neurological System: Knee Reflex Right: normal (2). Knee Reflex Left: normal (2). Ankle Reflex Right: normal (2). Ankle Reflex Left: normal (2). Babinski Reflex Right: plantar reflex absent. Babinski Reflex Left: plantar reflex absent. Sensation on the Right: normal distal extremities. Sensation on the Left: normal distal extremities. Special Tests on the Right: no clonus of the ankle/knee and seated straight leg raising test positive. Special Tests on the Left: no clonus of the ankle/knee and seated straight leg raising test positive.  Normal cervical lordosis. No pain with range of motion. No palpable tenderness. Motor is 5/5 in all groups in the upper extremities. Upper extremity sensory exam normal. Patient is normoreflexic in the upper extremities. No Hoffmann sign.   Neurological: She is alert and oriented to person, place, and time.    MRI demonstrates a large central disc herniation L3-4 with resultant severe spinal  stenosis.  Multilevel disc degeneration.  Assessment/Plan Patient is I bilateral radicular pain L4 nerve root distribution secondary to large disc herniation central and L3-4. Fracture rest activity modification exercise program and epidural steroid injection  We discussed living with her symptoms versus consideration of decompression.  It has been 11 weeks she would like to proceed with that.  I had an extensive discussion with the patient concerning the pathology relevant anatomy and treatment options. At this point exhausting conservative treatment and in the presence of a neurologic deficit we discussed microlumbar decompression. I discussed the risks and benefits including bleeding, infection, DVT, PE, anesthetic complications, worsening in their symptoms, improvement in their symptoms, C SF leakage, epidural fibrosis, need for future surgeries such as revision discectomy and lumbar fusion. I also indicated that this is an operation to basically decompress the nerve root to allow recovery as opposed to fixing a herniated disc and that the incidence of recurrent chest disc herniation can approach 15%. Also that nerve root recovery is variable and may not recover completely.  I discussed the operative course including overnight in the hospital. Immediate ambulation. Follow-up in 2 weeks for suture removal. 6 weeks until healing of the herniation followed by 6 weeks of reconditioning and strengthening of the core musculature. Also discussed the need to employ the concepts of disc pressure management and core motion following the surgery to minimize the risk of recurrent disc   herniation. We will obtain preoperative clearance i if necessary and proceed accordingly.  Any changes in the meantime she is to call. She had clearance prior to her total knee replacement should not require any prior to this. No changes in her health since this.  Plan microlumbar decompression L3-4   BISSELL, JACLYN M.,  PA-C for Dr. Beane 09/15/2018, 2:19 PM   

## 2018-09-15 NOTE — H&P (Signed)
Jocelyn Wiley is an 67 y.o. female.   Chief Complaint: back and L leg pain HPI: Reported by patient. Reason for Visit: (normal) review of test results (lumbar MRI); (normal) visit for: (back); The patient is 11 weeks out from when symptoms began. Severity: pain level 5/10 Timing: come and go Aggravating Factors: standing for ; sitting for ; lying down (on her left side) Associated Symptoms: numbness/tingling (left lower extremity); bilateral lower extremity pain Medications: helping a little; The patient takes Tramadol as needed and Gabapentin.  Past Medical History:  Diagnosis Date  . Anemia   . Anxiety   . Asthma    seasonal  . CKD (chronic kidney disease), stage I   . DDD (degenerative disc disease), lumbar   . 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 Glaucoma   . Heart murmur   . Hypertension   . Hypothyroidism   . Lumbar disc herniation   . Migraines   . OA (osteoarthritis)   . Polymyalgia rheumatica (HCC)   . PONV (postoperative nausea and vomiting)   . Shingles 2018   current 05/20/18 right leg no ulcerations    Past Surgical History:  Procedure Laterality Date  . APPENDECTOMY    . BACK SURGERY    . CHOLECYSTECTOMY    . COLONOSCOPY    . THYROIDECTOMY, PARTIAL    . TONSILLECTOMY    . TOTAL ABDOMINAL HYSTERECTOMY    . TOTAL KNEE ARTHROPLASTY Right   . TOTAL KNEE ARTHROPLASTY Left 05/20/2018   Procedure: LEFT TOTAL KNEE ARTHROPLASTY;  Surgeon: Jene EveryBeane, Jeffrey, MD;  Location: [facility];  Service: Orthopedics;  Laterality: Left;  120 mins    No family history on file. Social History:  reports that she has never smoked. She has never used smokeless tobacco. She reports current alcohol use. She reports that she does not use drugs.  Allergies:  Allergies  Allergen Reactions  . Hydrocodone Nausea And Vomiting  . Caffeine Palpitations   Medications: ascorbic acid (vitamin C) 500 mg tablet cyanocobalamin (vit B-12) 500 mcg tablet docusate sodium 100 mg  capsule Ecotrin 325 mg tablet,enteric coated fluticasone propionate 50 mcg/actuation nasal spray,suspension levothyroxine 75 mcg tablet loratadine 10 mg tablet metoprolol succinate ER 25 mg tablet,extended release 24 hr Mobic 15 mg tablet Neurontin 300 mg capsule ondansetron HCL 4 mg tablet polyethylene glycoL 3350 17 gram oral powder packet Premarin 0.9 mg tablet ProAir HFA 90 mcg/actuation aerosol inhaler timolol 0.5 % eye drops topiramate 25 mg tablet traMADoL 50 mg tablet traZODone valACYclovir 1 gram tablet valsartan 320 mg tablet  Review of Systems  Constitutional: Negative.   HENT: Negative.   Eyes: Negative.   Respiratory: Negative.   Cardiovascular: Negative.   Gastrointestinal: Negative.   Genitourinary: Negative.   Musculoskeletal: Positive for back pain.  Skin: Negative.   Neurological: Positive for sensory change and focal weakness.  Psychiatric/Behavioral: Negative.     There were no vitals taken for this visit. Physical Exam  Constitutional: She is oriented to person, place, and time. She appears well-developed and well-nourished. She appears distressed.  HENT:  Head: Normocephalic.  Eyes: Pupils are equal, round, and reactive to light.  Neck: Normal range of motion.  Cardiovascular: Normal rate.  Respiratory: Effort normal.  GI: Soft.  Musculoskeletal:     Comments: Patient is a 67 year old female.  Gait and Station: Appearance: ambulating with no assistive devices and antalgic gait.  Constitutional: General Appearance: healthy-appearing and distress (mild).  Psychiatric: Mood and Affect: active and  alert.  Cardiovascular System: Edema Right: none; Dorsalis and posterior tibial pulses 2+. Edema Left: none.  Abdomen: Inspection and Palpation: non-distended and no tenderness.  Skin: Inspection and palpation: no rash.  Lumbar Spine: Inspection: normal alignment. Bony Palpation of the Lumbar Spine: tender at lumbosacral junction.. Bony Palpation  of the Right Hip: no tenderness of the greater trochanter and tenderness of the SI joint; Pelvis stable. Bony Palpation of the Left Hip: no tenderness of the greater trochanter and tenderness of the SI joint. Soft Tissue Palpation on the Right: No flank pain with percussion. Active Range of Motion: limited flexion and extention.  Motor Strength: L1 Motor Strength on the Right: hip flexion iliopsoas 5/5. L1 Motor Strength on the Left: hip flexion iliopsoas 5/5. L2-L4 Motor Strength on the Right: knee extension quadriceps 5/5. L2-L4 Motor Strength on the Left: knee extension quadriceps 5/5. L5 Motor Strength on the Right: ankle dorsiflexion tibialis anterior 5/5 and great toe extension extensor hallucis longus 5/5. L5 Motor Strength on the Left: ankle dorsiflexion tibialis anterior 5/5 and great toe extension extensor hallucis longus 5/5. S1 Motor Strength on the Right: plantar flexion gastrocnemius 5/5. S1 Motor Strength on the Left: plantar flexion gastrocnemius 5/5.  Neurological System: Knee Reflex Right: normal (2). Knee Reflex Left: normal (2). Ankle Reflex Right: normal (2). Ankle Reflex Left: normal (2). Babinski Reflex Right: plantar reflex absent. Babinski Reflex Left: plantar reflex absent. Sensation on the Right: normal distal extremities. Sensation on the Left: normal distal extremities. Special Tests on the Right: no clonus of the ankle/knee and seated straight leg raising test positive. Special Tests on the Left: no clonus of the ankle/knee and seated straight leg raising test positive.  Normal cervical lordosis. No pain with range of motion. No palpable tenderness. Motor is 5/5 in all groups in the upper extremities. Upper extremity sensory exam normal. Patient is normoreflexic in the upper extremities. No Hoffmann sign.   Neurological: She is alert and oriented to person, place, and time.    MRI demonstrates a large central disc herniation L3-4 with resultant severe spinal  stenosis.  Multilevel disc degeneration.  Assessment/Plan Patient is I bilateral radicular pain L4 nerve root distribution secondary to large disc herniation central and L3-4. Fracture rest activity modification exercise program and epidural steroid injection  We discussed living with her symptoms versus consideration of decompression.  It has been 11 weeks she would like to proceed with that.  I had an extensive discussion with the patient concerning the pathology relevant anatomy and treatment options. At this point exhausting conservative treatment and in the presence of a neurologic deficit we discussed microlumbar decompression. I discussed the risks and benefits including bleeding, infection, DVT, PE, anesthetic complications, worsening in their symptoms, improvement in their symptoms, C SF leakage, epidural fibrosis, need for future surgeries such as revision discectomy and lumbar fusion. I also indicated that this is an operation to basically decompress the nerve root to allow recovery as opposed to fixing a herniated disc and that the incidence of recurrent chest disc herniation can approach 15%. Also that nerve root recovery is variable and may not recover completely.  I discussed the operative course including overnight in the hospital. Immediate ambulation. Follow-up in 2 weeks for suture removal. 6 weeks until healing of the herniation followed by 6 weeks of reconditioning and strengthening of the core musculature. Also discussed the need to employ the concepts of disc pressure management and core motion following the surgery to minimize the risk of recurrent disc  herniation. We will obtain preoperative clearance i if necessary and proceed accordingly.  Any changes in the meantime she is to call. She had clearance prior to her total knee replacement should not require any prior to this. No changes in her health since this.  Plan microlumbar decompression L3-4   Dorothy SparkBISSELL, [place] Dube M.,  PA-C for Dr. Shelle IronBeane 09/15/2018, 2:19 PM

## 2018-09-15 NOTE — Pre-Procedure Instructions (Signed)
Jocelyn Wiley  09/15/2018     Your procedure is scheduled on Thursday, January 9.  [facility]:30 AM                   Your surgery or procedure is scheduled for 7:30 AM   Call this number if you have problems the morning of surgery: [phone]  This is the number for the Pre- Surgical Desk.                 For any other questions, please call [phone], Monday - Friday 8 AM - 4 PM.   Remember:  Do not eat or drink after midnight Wednesday, January 8.    Take these medicines the morning of surgery with A SIP OF WATER : levothyroxine (SYNTHROID, LEVOTHROID)  metoprolol succinate (TOPROL-XL) PREMARIN Eye drops   Take if needed: traMADol (ULTRAM) topiramate (TOPAMAX)  ipratropium (ATROVENT) nasal spray PROAIR HFA  Inhaler >>Please bring it with you.   Follow Dr Ermelinda DasBeane's instructions on Aspirin.  1 Week prior to surgery STOP taking  Aspirin Products (Goody Powder, Excedrin Migraine), Ibuprofen (Advil), Naproxen (Aleve), Vitamins and Herbal Products (ie Fish Oil).  Special instructions: [facility]- Preparing For Surgery  Before surgery, you can play an important role. Because skin is not sterile, your skin needs to be as free of germs as possible. You can reduce the number of germs on your skin by washing with CHG (chlorahexidine gluconate) Soap before surgery.  CHG is an antiseptic cleaner which kills germs and bonds with the skin to continue killing germs even after washing.    Oral Hygiene is also important to reduce your risk of infection.  Remember - BRUSH YOUR TEETH THE MORNING OF SURGERY WITH YOUR REGULAR TOOTHPASTE  Please do not use if you have an allergy to CHG or antibacterial soaps. If your skin becomes reddened/irritated stop using the CHG.  Do not shave (including legs and underarms) for at least 48 hours prior to first CHG shower. It is OK to shave your face.  Please follow these instructions carefully.   1. Shower  the NIGHT BEFORE SURGERY and the MORNING OF SURGERY with CHG.   2. If you chose to wash your hair, wash your hair first as usual with your normal shampoo.  3. After you shampoo,wash your face and private area with the soap you use at home, then rinse your hair and body thoroughly to remove the shampoo and soap.   4. Use CHG as you would any other liquid soap. You can apply CHG directly to the skin and wash gently with a scrungie or a clean washcloth.   5. Apply the CHG Soap to your body ONLY FROM THE NECK DOWN.  Do not use on open wounds or open sores. Avoid contact with your eyes, ears, mouth and genitals (private parts).  6. Wash thoroughly, paying special attention to the area where your surgery will be performed.  7. Thoroughly rinse your body with warm water from the neck down.  8. DO NOT shower/wash with your normal soap after using and rinsing off the CHG Soap.  9. Pat yourself dry with a CLEAN TOWEL.  10. Wear CLEAN PAJAMAS to bed the night before surgery, wear comfortable clothes the morning of surgery  11. Place CLEAN SHEETS on your bed the night of your first shower and DO NOT SLEEP WITH PETS.  Day of Surgery:  Shower as instructed above  Do not apply any deodorants/lotions, powders or colognes.  Please wear clean clothes to the hospital/surgery center.   Remember to brush your teeth WITH YOUR REGULAR TOOTHPASTE.  Do not shave 48 hours prior to surgery.    Do not bring valuables to the hospital.  [facility] any belongings or valuables.  Contacts, dentures or bridgework may not be worn into surgery.  Leave your suitcase in the car.  After surgery it may be brought to your room.  For patients admitted to the hospital, discharge time will be determined by your treatment team.  Please read over the following fact sheets that you were given: Pain Booklet, Patient Instructions for Mupirocin Application, Coughing and Deep Breathing, Surgical Site  Infections.

## 2018-09-17 ENCOUNTER — Encounter (HOSPITAL_COMMUNITY)
Admission: RE | Admit: 2018-09-17 | Discharge: 2018-09-17 | Disposition: A | Discharge status: Home / Self Care | Payer: Medicare Other | Source: Ambulatory Visit | Attending: Specialist | Admitting: Specialist

## 2018-09-17 ENCOUNTER — Encounter (HOSPITAL_COMMUNITY): Payer: Self-pay

## 2018-09-17 ENCOUNTER — Other Ambulatory Visit: Payer: Self-pay

## 2018-09-17 ENCOUNTER — Ambulatory Visit (HOSPITAL_COMMUNITY)
Admission: RE | Admit: 2018-09-17 | Discharge: 2018-09-17 | Disposition: A | Discharge status: Home / Self Care | Payer: Medicare Other | Source: Ambulatory Visit | Attending: Orthopedic Surgery | Admitting: Orthopedic Surgery

## 2018-09-17 DIAGNOSIS — M5126 Other intervertebral disc displacement, lumbar region: Secondary | ICD-10-CM | POA: Insufficient documentation

## 2018-09-17 LAB — BASIC METABOLIC PANEL
Anion gap: 9 (ref 5–15)
BUN: 8 mg/dL (ref 8–23)
CO2: 26 mmol/L (ref 22–32)
Calcium: 9.3 mg/dL (ref 8.9–10.3)
Chloride: 102 mmol/L (ref 98–111)
Creatinine, Ser: 1.48 mg/dL — ABNORMAL HIGH (ref 0.44–1.00)
GFR calc Af Amer: 42 mL/min — ABNORMAL LOW (ref >60)
GFR calc non Af Amer: 36 mL/min — ABNORMAL LOW (ref >60)
Glucose, Bld: 100 mg/dL — ABNORMAL HIGH (ref 70–99)
Potassium: 3.8 mmol/L (ref 3.5–5.1)
Sodium: 137 mmol/L (ref 135–145)

## 2018-09-17 LAB — CBC
HEMATOCRIT: 35.7 % — AB (ref 36.0–46.0)
Hemoglobin: 11.3 g/dL — ABNORMAL LOW (ref 12.0–15.0)
MCH: 27.6 pg (ref 26.0–34.0)
MCHC: 31.7 g/dL (ref 30.0–36.0)
MCV: 87.3 fL (ref 80.0–100.0)
Platelets: 272 10*3/uL (ref 150–400)
RBC: 4.09 MIL/uL (ref 3.87–5.11)
RDW: 12.8 % (ref 11.5–15.5)
WBC: 4.6 10*3/uL (ref 4.0–10.5)
nRBC: 0 % (ref 0.0–0.2)

## 2018-09-17 LAB — SURGICAL PCR SCREEN
MRSA, PCR: NEGATIVE
Staphylococcus aureus: NEGATIVE

## 2018-09-17 LAB — NO BLOOD PRODUCTS

## 2018-09-17 NOTE — Progress Notes (Addendum)
PCP - Karna Dupes, [facility]  Cardiologist - Dr.Folk [facility] Chest x-ray - 05/12/2018 EKG - 05/12/2018  Stress Test - 04/30/16  ECHO - 03/13/2018 Cardiac Cath - NA   Sleep Study - NA CPAP -   LABS- CBC BMP  ASA- has been off for a week- epidural- was on it post op knee replacement  HA1C-na Fasting Blood Sugar - na Checks Blood Sugar ___0__ times a day  Anesthesia-  Pt denies having chest pain, sob, or fever at this time. All instructions explained to the pt, with a verbal understanding of the material. Pt agrees to go over the instructions while at home for a better understanding. The opportunity to ask questions was provided.

## 2018-09-24 ENCOUNTER — Encounter (HOSPITAL_COMMUNITY): Admission: RE | Disposition: A | Discharge status: Home / Self Care | Payer: Self-pay | Attending: Specialist

## 2018-09-24 ENCOUNTER — Ambulatory Visit (HOSPITAL_COMMUNITY)
Admission: RE | Admit: 2018-09-24 | Discharge: 2018-09-25 | Disposition: A | Discharge status: Home / Self Care | Payer: Medicare Other | Source: Other Acute Inpatient Hospital | Attending: Specialist | Admitting: Specialist

## 2018-09-24 ENCOUNTER — Ambulatory Visit (HOSPITAL_COMMUNITY): Payer: Medicare Other | Admitting: Anesthesiology

## 2018-09-24 ENCOUNTER — Encounter (HOSPITAL_COMMUNITY): Payer: Self-pay | Admitting: *Deleted

## 2018-09-24 ENCOUNTER — Ambulatory Visit (HOSPITAL_COMMUNITY): Payer: Medicare Other

## 2018-09-24 DIAGNOSIS — H409 Unspecified glaucoma: Secondary | ICD-10-CM | POA: Diagnosis not present

## 2018-09-24 DIAGNOSIS — M5126 Other intervertebral disc displacement, lumbar region: Secondary | ICD-10-CM | POA: Diagnosis not present

## 2018-09-24 DIAGNOSIS — Z419 Encounter for procedure for purposes other than remedying health state, unspecified: Secondary | ICD-10-CM

## 2018-09-24 DIAGNOSIS — G43909 Migraine, unspecified, not intractable, without status migrainosus: Secondary | ICD-10-CM | POA: Diagnosis not present

## 2018-09-24 DIAGNOSIS — J45909 Unspecified asthma, uncomplicated: Secondary | ICD-10-CM | POA: Diagnosis not present

## 2018-09-24 DIAGNOSIS — Z79890 Hormone replacement therapy (postmenopausal): Secondary | ICD-10-CM | POA: Insufficient documentation

## 2018-09-24 DIAGNOSIS — Z791 Long term (current) use of non-steroidal anti-inflammatories (NSAID): Secondary | ICD-10-CM | POA: Diagnosis not present

## 2018-09-24 DIAGNOSIS — M48061 Spinal stenosis, lumbar region without neurogenic claudication: Secondary | ICD-10-CM | POA: Diagnosis present

## 2018-09-24 DIAGNOSIS — M549 Dorsalgia, unspecified: Secondary | ICD-10-CM | POA: Diagnosis present

## 2018-09-24 DIAGNOSIS — N181 Chronic kidney disease, stage 1: Secondary | ICD-10-CM | POA: Insufficient documentation

## 2018-09-24 DIAGNOSIS — Z79899 Other long term (current) drug therapy: Secondary | ICD-10-CM | POA: Diagnosis not present

## 2018-09-24 DIAGNOSIS — Z7951 Long term (current) use of inhaled steroids: Secondary | ICD-10-CM | POA: Diagnosis not present

## 2018-09-24 DIAGNOSIS — Z96653 Presence of artificial knee joint, bilateral: Secondary | ICD-10-CM | POA: Diagnosis not present

## 2018-09-24 DIAGNOSIS — Z7982 Long term (current) use of aspirin: Secondary | ICD-10-CM | POA: Diagnosis not present

## 2018-09-24 DIAGNOSIS — E890 Postprocedural hypothyroidism: Secondary | ICD-10-CM | POA: Diagnosis not present

## 2018-09-24 DIAGNOSIS — I129 Hypertensive chronic kidney disease with stage 1 through stage 4 chronic kidney disease, or unspecified chronic kidney disease: Secondary | ICD-10-CM | POA: Diagnosis not present

## 2018-09-24 HISTORY — PX: LUMBAR LAMINECTOMY/DECOMPRESSION MICRODISCECTOMY: SHX5026

## 2018-09-24 SURGERY — LUMBAR LAMINECTOMY/DECOMPRESSION MICRODISCECTOMY 1 LEVEL
Anesthesia: General | Site: Spine Lumbar

## 2018-09-24 MED ORDER — PROPOFOL 10 MG/ML IV BOLUS
INTRAVENOUS | Status: AC
  Filled 2018-09-24: qty 40

## 2018-09-24 MED ORDER — DEXAMETHASONE SODIUM PHOSPHATE 4 MG/ML IJ SOLN
INTRAMUSCULAR | Status: DC | PRN
Start: 2018-09-24 — End: 2018-09-24
  Administered 2018-09-24: 5 mg via INTRAVENOUS

## 2018-09-24 MED ORDER — GLYCOPYRROLATE 0.2 MG/ML IJ SOLN
INTRAMUSCULAR | Status: DC | PRN
  Administered 2018-09-24: 0.2 mg via INTRAVENOUS

## 2018-09-24 MED ORDER — LABETALOL HCL 5 MG/ML IV SOLN
5.0000 mg | Freq: Once | INTRAVENOUS | Status: AC
  Administered 2018-09-24: 10 mg via INTRAVENOUS

## 2018-09-24 MED ORDER — TRIAMTERENE-HCTZ 37.5-25 MG PO TABS: 1.0000 | ORAL_TABLET | ORAL | Status: DC

## 2018-09-24 MED ORDER — POLYETHYLENE GLYCOL 3350 17 G PO PACK: 17.0000 g | PACK | Freq: Every day | ORAL | Status: DC | PRN

## 2018-09-24 MED ORDER — BUPIVACAINE-EPINEPHRINE (PF) 0.25% -1:200000 IJ SOLN
INTRAMUSCULAR | Status: AC
  Filled 2018-09-24: qty 30

## 2018-09-24 MED ORDER — CEFAZOLIN SODIUM-DEXTROSE 2-4 GM/100ML-% IV SOLN
INTRAVENOUS | Status: AC
  Filled 2018-09-24: qty 100

## 2018-09-24 MED ORDER — ACETAMINOPHEN 650 MG RE SUPP: 650.0000 mg | RECTAL | Status: DC | PRN

## 2018-09-24 MED ORDER — ACETAMINOPHEN 325 MG PO TABS
650.0000 mg | ORAL_TABLET | ORAL | Status: DC | PRN
  Administered 2018-09-25: 650 mg via ORAL
  Filled 2018-09-24: qty 2

## 2018-09-24 MED ORDER — DOCUSATE SODIUM 100 MG PO CAPS
100.0000 mg | ORAL_CAPSULE | Freq: Two times a day (BID) | ORAL | Status: DC
  Administered 2018-09-24 – 2018-09-25 (×3): 100 mg via ORAL
  Filled 2018-09-24 (×3): qty 1

## 2018-09-24 MED ORDER — ONDANSETRON HCL 4 MG/2ML IJ SOLN
INTRAMUSCULAR | Status: AC
  Filled 2018-09-24: qty 2

## 2018-09-24 MED ORDER — CEFAZOLIN SODIUM-DEXTROSE 2-4 GM/100ML-% IV SOLN
2.0000 g | Freq: Three times a day (TID) | INTRAVENOUS | Status: AC
  Administered 2018-09-24 (×2): 2 g via INTRAVENOUS
  Filled 2018-09-24 (×2): qty 100

## 2018-09-24 MED ORDER — GABAPENTIN 300 MG PO CAPS
300.0000 mg | ORAL_CAPSULE | Freq: Three times a day (TID) | ORAL | Status: DC
  Administered 2018-09-24 – 2018-09-25 (×3): 300 mg via ORAL
  Filled 2018-09-24 (×3): qty 1

## 2018-09-24 MED ORDER — MAGNESIUM CITRATE PO SOLN: 1.0000 | Freq: Once | ORAL | Status: DC | PRN

## 2018-09-24 MED ORDER — FENTANYL CITRATE (PF) 100 MCG/2ML IJ SOLN
INTRAMUSCULAR | Status: DC | PRN
  Administered 2018-09-24 (×3): 50 ug via INTRAVENOUS

## 2018-09-24 MED ORDER — POTASSIUM CHLORIDE IN NACL 20-0.9 MEQ/L-% IV SOLN: INTRAVENOUS | Status: AC

## 2018-09-24 MED ORDER — FENTANYL CITRATE (PF) 100 MCG/2ML IJ SOLN
25.0000 ug | INTRAMUSCULAR | Status: DC | PRN
  Administered 2018-09-24 (×2): 50 ug via INTRAVENOUS

## 2018-09-24 MED ORDER — OXYCODONE HCL 5 MG PO TABS: 5.0000 mg | ORAL_TABLET | ORAL | Status: DC | PRN

## 2018-09-24 MED ORDER — LIDOCAINE 2% (20 MG/ML) 5 ML SYRINGE
INTRAMUSCULAR | Status: DC | PRN
  Administered 2018-09-24: 60 mg via INTRAVENOUS

## 2018-09-24 MED ORDER — TIMOLOL MALEATE 0.5 % OP SOLN
1.0000 [drp] | Freq: Two times a day (BID) | OPHTHALMIC | Status: DC
  Administered 2018-09-24: 1 [drp] via OPHTHALMIC
  Filled 2018-09-24: qty 5

## 2018-09-24 MED ORDER — ONDANSETRON HCL 4 MG PO TABS: 4.0000 mg | ORAL_TABLET | Freq: Four times a day (QID) | ORAL | Status: DC | PRN

## 2018-09-24 MED ORDER — METHOCARBAMOL 500 MG PO TABS
500.0000 mg | ORAL_TABLET | Freq: Four times a day (QID) | ORAL | Status: DC | PRN
  Administered 2018-09-24 – 2018-09-25 (×3): 500 mg via ORAL
  Filled 2018-09-24 (×3): qty 1

## 2018-09-24 MED ORDER — ONDANSETRON HCL 4 MG/2ML IJ SOLN: 4.0000 mg | Freq: Four times a day (QID) | INTRAMUSCULAR | Status: DC | PRN

## 2018-09-24 MED ORDER — ACETAMINOPHEN 10 MG/ML IV SOLN
INTRAVENOUS | Status: AC
  Filled 2018-09-24: qty 100

## 2018-09-24 MED ORDER — ESTROGENS CONJUGATED 0.45 MG PO TABS
0.9000 mg | ORAL_TABLET | Freq: Every day | ORAL | Status: DC
  Administered 2018-09-25: 0.9 mg via ORAL
  Filled 2018-09-24: qty 2
  Filled 2018-09-24: qty 1

## 2018-09-24 MED ORDER — DOCUSATE SODIUM 100 MG PO CAPS: 100.0000 mg | ORAL_CAPSULE | Freq: Two times a day (BID) | ORAL | Status: DC

## 2018-09-24 MED ORDER — TRAMADOL HCL 50 MG PO TABS
50.0000 mg | ORAL_TABLET | Freq: Four times a day (QID) | ORAL | Status: DC | PRN
  Administered 2018-09-24 – 2018-09-25 (×5): 50 mg via ORAL
  Filled 2018-09-24 (×4): qty 1

## 2018-09-24 MED ORDER — 0.9 % SODIUM CHLORIDE (POUR BTL) OPTIME
TOPICAL | Status: DC | PRN
  Administered 2018-09-24: 1000 mL

## 2018-09-24 MED ORDER — SUGAMMADEX SODIUM 200 MG/2ML IV SOLN
INTRAVENOUS | Status: DC | PRN
  Administered 2018-09-24: 200 mg via INTRAVENOUS

## 2018-09-24 MED ORDER — ACETAMINOPHEN 10 MG/ML IV SOLN
1000.0000 mg | INTRAVENOUS | Status: AC
  Administered 2018-09-24: 1000 mg via INTRAVENOUS

## 2018-09-24 MED ORDER — MIDAZOLAM HCL 5 MG/5ML IJ SOLN
INTRAMUSCULAR | Status: DC | PRN
  Administered 2018-09-24: 2 mg via INTRAVENOUS

## 2018-09-24 MED ORDER — ONDANSETRON HCL 4 MG/2ML IJ SOLN
INTRAMUSCULAR | Status: DC | PRN
  Administered 2018-09-24: 4 mg via INTRAVENOUS

## 2018-09-24 MED ORDER — LACTATED RINGERS IV SOLN
INTRAVENOUS | Status: DC
  Administered 2018-09-24: 07:00:00 via INTRAVENOUS

## 2018-09-24 MED ORDER — ALUM & MAG HYDROXIDE-SIMETH 200-200-20 MG/5ML PO SUSP: 30.0000 mL | Freq: Four times a day (QID) | ORAL | Status: DC | PRN

## 2018-09-24 MED ORDER — MIDAZOLAM HCL 2 MG/2ML IJ SOLN
INTRAMUSCULAR | Status: AC
  Filled 2018-09-24: qty 2

## 2018-09-24 MED ORDER — THROMBIN 20000 UNITS EX SOLR
CUTANEOUS | Status: DC | PRN
  Administered 2018-09-24: 20 mL via TOPICAL

## 2018-09-24 MED ORDER — FENTANYL CITRATE (PF) 100 MCG/2ML IJ SOLN
INTRAMUSCULAR | Status: AC
  Filled 2018-09-24: qty 2

## 2018-09-24 MED ORDER — DEXAMETHASONE SODIUM PHOSPHATE 10 MG/ML IJ SOLN
INTRAMUSCULAR | Status: AC
  Filled 2018-09-24: qty 1

## 2018-09-24 MED ORDER — PROMETHAZINE HCL 25 MG/ML IJ SOLN: 6.2500 mg | INTRAMUSCULAR | Status: DC | PRN

## 2018-09-24 MED ORDER — ROCURONIUM BROMIDE 50 MG/5ML IV SOSY
PREFILLED_SYRINGE | INTRAVENOUS | Status: AC
  Filled 2018-09-24: qty 5

## 2018-09-24 MED ORDER — OXYCODONE HCL 5 MG PO TABS: 10.0000 mg | ORAL_TABLET | ORAL | Status: DC | PRN

## 2018-09-24 MED ORDER — ROCURONIUM BROMIDE 50 MG/5ML IV SOSY
PREFILLED_SYRINGE | INTRAVENOUS | Status: DC | PRN
  Administered 2018-09-24: 40 mg via INTRAVENOUS

## 2018-09-24 MED ORDER — IPRATROPIUM BROMIDE 0.06 % NA SOLN
2.0000 | Freq: Four times a day (QID) | NASAL | Status: DC | PRN
  Filled 2018-09-24: qty 15

## 2018-09-24 MED ORDER — ALBUTEROL SULFATE (2.5 MG/3ML) 0.083% IN NEBU: 3.0000 mL | INHALATION_SOLUTION | RESPIRATORY_TRACT | Status: DC | PRN

## 2018-09-24 MED ORDER — TRIAMTERENE-HCTZ 37.5-25 MG PO TABS
1.0000 | ORAL_TABLET | Freq: Once | ORAL | Status: AC
  Administered 2018-09-24: 1 via ORAL
  Filled 2018-09-24: qty 1

## 2018-09-24 MED ORDER — FENTANYL CITRATE (PF) 250 MCG/5ML IJ SOLN
INTRAMUSCULAR | Status: AC
  Filled 2018-09-24: qty 5

## 2018-09-24 MED ORDER — BISACODYL 5 MG PO TBEC: 5.0000 mg | DELAYED_RELEASE_TABLET | Freq: Every day | ORAL | Status: DC | PRN

## 2018-09-24 MED ORDER — LABETALOL HCL 5 MG/ML IV SOLN
INTRAVENOUS | Status: AC
  Filled 2018-09-24: qty 4

## 2018-09-24 MED ORDER — SODIUM CHLORIDE 0.9 % IV SOLN
INTRAVENOUS | Status: DC | PRN
  Administered 2018-09-24: 50 ug/min via INTRAVENOUS

## 2018-09-24 MED ORDER — TOPIRAMATE 25 MG PO TABS
25.0000 mg | ORAL_TABLET | Freq: Every day | ORAL | Status: DC | PRN
  Filled 2018-09-24: qty 1

## 2018-09-24 MED ORDER — MENTHOL 3 MG MT LOZG: 1.0000 | LOZENGE | OROMUCOSAL | Status: DC | PRN

## 2018-09-24 MED ORDER — TRAMADOL HCL 50 MG PO TABS
ORAL_TABLET | ORAL | Status: AC
  Filled 2018-09-24: qty 1

## 2018-09-24 MED ORDER — KCL IN DEXTROSE-NACL 20-5-0.45 MEQ/L-%-% IV SOLN: INTRAVENOUS | Status: AC

## 2018-09-24 MED ORDER — CEFAZOLIN SODIUM-DEXTROSE 2-4 GM/100ML-% IV SOLN
2.0000 g | INTRAVENOUS | Status: AC
  Administered 2018-09-24: 2 g via INTRAVENOUS

## 2018-09-24 MED ORDER — GABAPENTIN 100 MG PO CAPS
100.0000 mg | ORAL_CAPSULE | Freq: Every day | ORAL | Status: DC | PRN
  Administered 2018-09-25: 100 mg via ORAL
  Filled 2018-09-24: qty 1

## 2018-09-24 MED ORDER — RISAQUAD PO CAPS
1.0000 | ORAL_CAPSULE | Freq: Every day | ORAL | Status: DC
  Administered 2018-09-25: 1 via ORAL
  Filled 2018-09-24 (×2): qty 1

## 2018-09-24 MED ORDER — LEVOTHYROXINE SODIUM 75 MCG PO TABS
75.0000 ug | ORAL_TABLET | Freq: Every day | ORAL | Status: DC
  Administered 2018-09-25: 75 ug via ORAL
  Filled 2018-09-24: qty 1

## 2018-09-24 MED ORDER — SODIUM CHLORIDE 0.9 % IV SOLN
INTRAVENOUS | Status: DC | PRN
  Administered 2018-09-24: 500 mL

## 2018-09-24 MED ORDER — PROPOFOL 10 MG/ML IV BOLUS
INTRAVENOUS | Status: DC | PRN
  Administered 2018-09-24: 150 mg via INTRAVENOUS
  Administered 2018-09-24: 50 mg via INTRAVENOUS

## 2018-09-24 MED ORDER — BUPIVACAINE-EPINEPHRINE (PF) 0.25% -1:200000 IJ SOLN
INTRAMUSCULAR | Status: DC | PRN
  Administered 2018-09-24: 11 mL

## 2018-09-24 MED ORDER — THROMBIN 20000 UNITS EX SOLR
CUTANEOUS | Status: AC
  Filled 2018-09-24: qty 20000

## 2018-09-24 MED ORDER — METHOCARBAMOL 1000 MG/10ML IJ SOLN
500.0000 mg | Freq: Four times a day (QID) | INTRAVENOUS | Status: DC | PRN
  Filled 2018-09-24: qty 5

## 2018-09-24 MED ORDER — METOPROLOL SUCCINATE ER 25 MG PO TB24
25.0000 mg | ORAL_TABLET | Freq: Every day | ORAL | Status: DC
  Administered 2018-09-25: 25 mg via ORAL
  Filled 2018-09-24 (×2): qty 1

## 2018-09-24 MED ORDER — TRIAMTERENE-HCTZ 37.5-25 MG PO TABS
1.0000 | ORAL_TABLET | Freq: Every day | ORAL | Status: DC
  Filled 2018-09-24: qty 1

## 2018-09-24 MED ORDER — PHENOL 1.4 % MT LIQD: 1.0000 | OROMUCOSAL | Status: DC | PRN

## 2018-09-24 MED ORDER — LIDOCAINE 2% (20 MG/ML) 5 ML SYRINGE
INTRAMUSCULAR | Status: AC
  Filled 2018-09-24: qty 5

## 2018-09-24 SURGICAL SUPPLY — 60 items
BAG DECANTER FOR FLEXI CONT (MISCELLANEOUS) ×3 IMPLANT
CLEANER TIP ELECTROSURG 2X2 (MISCELLANEOUS) ×3 IMPLANT
CLOSURE WOUND 1/2 X4 (GAUZE/BANDAGES/DRESSINGS) ×1
CLOTH 2% CHLOROHEXIDINE 3PK (PERSONAL CARE ITEMS) ×3 IMPLANT
CONT SPEC 4OZ CLIKSEAL STRL BL (MISCELLANEOUS) ×3 IMPLANT
COVER WAND RF STERILE (DRAPES) ×1 IMPLANT
DRAPE LAPAROTOMY 100X72X124 (DRAPES) ×3 IMPLANT
DRAPE MICROSCOPE LEICA (MISCELLANEOUS) ×3 IMPLANT
DRAPE SHEET LG 3/4 BI-LAMINATE (DRAPES) ×1 IMPLANT
DRAPE SURG 17X11 SM STRL (DRAPES) ×3 IMPLANT
DRAPE UTILITY XL STRL (DRAPES) ×3 IMPLANT
DRESSING AQUACEL AG SP 3.5X6 (GAUZE/BANDAGES/DRESSINGS) IMPLANT
DRSG AQUACEL AG ADV 3.5X 4 (GAUZE/BANDAGES/DRESSINGS) IMPLANT
DRSG AQUACEL AG ADV 3.5X 6 (GAUZE/BANDAGES/DRESSINGS) ×2 IMPLANT
DRSG AQUACEL AG SP 3.5X6 (GAUZE/BANDAGES/DRESSINGS) ×3
DRSG TELFA 3X8 NADH (GAUZE/BANDAGES/DRESSINGS) IMPLANT
DURAPREP 26ML APPLICATOR (WOUND CARE) ×3 IMPLANT
DURASEAL SPINE SEALANT 3ML (MISCELLANEOUS) IMPLANT
ELECT BLADE 4.0 EZ CLEAN MEGAD (MISCELLANEOUS) ×3
ELECT REM PT RETURN 9FT ADLT (ELECTROSURGICAL) ×3
ELECTRODE BLDE 4.0 EZ CLN MEGD (MISCELLANEOUS) IMPLANT
ELECTRODE REM PT RTRN 9FT ADLT (ELECTROSURGICAL) ×1 IMPLANT
GLOVE BIOGEL PI IND STRL 7.0 (GLOVE) ×1 IMPLANT
GLOVE BIOGEL PI IND STRL 7.5 (GLOVE) IMPLANT
GLOVE BIOGEL PI INDICATOR 7.0 (GLOVE) ×8
GLOVE BIOGEL PI INDICATOR 7.5 (GLOVE) ×2
GLOVE SURG SS PI 7.5 STRL IVOR (GLOVE) ×3 IMPLANT
GLOVE SURG SS PI 8.0 STRL IVOR (GLOVE) ×6 IMPLANT
GOWN STRL REUS W/ TWL LRG LVL3 (GOWN DISPOSABLE) ×1 IMPLANT
GOWN STRL REUS W/ TWL XL LVL3 (GOWN DISPOSABLE) ×1 IMPLANT
GOWN STRL REUS W/TWL LRG LVL3 (GOWN DISPOSABLE)
GOWN STRL REUS W/TWL XL LVL3 (GOWN DISPOSABLE) ×9
IV CATH 14GX2 1/4 (CATHETERS) ×3 IMPLANT
KIT BASIN OR (CUSTOM PROCEDURE TRAY) ×3 IMPLANT
KIT POSITION SURG JACKSON T1 (MISCELLANEOUS) ×3 IMPLANT
NDL SPNL 18GX3.5 QUINCKE PK (NEEDLE) ×2 IMPLANT
NEEDLE 22X1 1/2 (OR ONLY) (NEEDLE) ×3 IMPLANT
NEEDLE SPNL 18GX3.5 QUINCKE PK (NEEDLE) ×3 IMPLANT
PACK LAMINECTOMY NEURO (CUSTOM PROCEDURE TRAY) ×3 IMPLANT
PAD DRESSING TELFA 3X8 NADH (GAUZE/BANDAGES/DRESSINGS) IMPLANT
PATTIES SURGICAL .5 X.5 (GAUZE/BANDAGES/DRESSINGS) ×2 IMPLANT
PATTIES SURGICAL .75X.75 (GAUZE/BANDAGES/DRESSINGS) ×3 IMPLANT
RUBBERBAND STERILE (MISCELLANEOUS) ×6 IMPLANT
SPONGE LAP 4X18 RFD (DISPOSABLE) IMPLANT
SPONGE SURGIFOAM ABS GEL 100 (HEMOSTASIS) ×3 IMPLANT
STAPLER VISISTAT (STAPLE) ×2 IMPLANT
STRIP CLOSURE SKIN 1/2X4 (GAUZE/BANDAGES/DRESSINGS) ×2 IMPLANT
SUT NURALON 4 0 TR CR/8 (SUTURE) IMPLANT
SUT PROLENE 3 0 PS 2 (SUTURE) ×2 IMPLANT
SUT VIC AB 1 CT1 27 (SUTURE) ×3
SUT VIC AB 1 CT1 27XBRD ANTBC (SUTURE) IMPLANT
SUT VIC AB 1-0 CT2 27 (SUTURE) IMPLANT
SUT VIC AB 2-0 CT1 27 (SUTURE) ×3
SUT VIC AB 2-0 CT1 TAPERPNT 27 (SUTURE) IMPLANT
SUT VIC AB 2-0 CT2 27 (SUTURE) ×2 IMPLANT
SYR 3ML LL SCALE MARK (SYRINGE) ×3 IMPLANT
TOWEL GREEN STERILE (TOWEL DISPOSABLE) ×3 IMPLANT
TOWEL GREEN STERILE FF (TOWEL DISPOSABLE) ×3 IMPLANT
TRAY CATH 16FR W/PLASTIC CATH (SET/KITS/TRAYS/PACK) ×2 IMPLANT
YANKAUER SUCT BULB TIP NO VENT (SUCTIONS) ×3 IMPLANT

## 2018-09-24 NOTE — Brief Op Note (Signed)
09/24/2018  9:40 AM  PATIENT:  Jocelyn Wiley  68 y.o. 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  POST-OPERATIVE DIAGNOSIS:  Herniated Nucleus Pulposus Lumbar Three-Four  PROCEDURE:  Procedure(s) with comments: LUMBAR DECOMPRESSION LUMBAR THREE-FOUR (N/A) - posterior  SURGEON:  Surgeon(s) and Role:    Jene Every, MD - Primary  PHYSICIAN ASSISTANT:   ASSISTANTS: Bissell   ANESTHESIA:   general  EBL:  100 mL   BLOOD ADMINISTERED:none  DRAINS: none   LOCAL MEDICATIONS USED:  MARCAINE     SPECIMEN:  No Specimen  DISPOSITION OF SPECIMEN:  N/A  COUNTS:  YES  TOURNIQUET:  * No tourniquets in log *  DICTATION: .Other Dictation: Dictation Number [id number]  PLAN OF CARE: Admit for overnight observation  PATIENT DISPOSITION:  PACU - hemodynamically stable.   Delay start of Pharmacological VTE agent (>24hrs) due to surgical blood loss or risk of bleeding: yes

## 2018-09-24 NOTE — Discharge Instructions (Signed)

## 2018-09-24 NOTE — Anesthesia Procedure Notes (Signed)
Procedure Name: Intubation Date/Time: 09/24/2018 7:35 AM Performed by: Caren Macadamarter, Jentzen Minasyan W, CRNA Pre-anesthesia Checklist: Patient identified, Emergency Drugs available, Suction available and Patient being monitored Patient Re-evaluated:Patient Re-evaluated prior to induction Oxygen Delivery Method: Circle system utilized Preoxygenation: Pre-oxygenation with 100% oxygen Induction Type: IV induction Ventilation: Mask ventilation without difficulty Laryngoscope Size: Miller and 2 Grade View: Grade I Tube type: Oral Tube size: 7.0 mm Number of attempts: 1 Airway Equipment and Method: Stylet Placement Confirmation: ETT inserted through vocal cords under direct vision,  positive ETCO2 and breath sounds checked- equal and bilateral Secured at: 22 cm Tube secured with: Tape Dental Injury: Teeth and Oropharynx as per pre-operative assessment

## 2018-09-24 NOTE — Transfer of Care (Signed)
Immediate Anesthesia Transfer of Care Note  Patient: Jocelyn Wiley  Procedure(s) Performed: LUMBAR DECOMPRESSION LUMBAR THREE-FOUR (N/A Spine Lumbar)  Patient Location: PACU  Anesthesia Type:General  Level of Consciousness: sedated  Airway & Oxygen Therapy: Patient Spontanous Breathing and Patient connected to face mask oxygen  Post-op Assessment: Report given to RN and Post -op Vital signs reviewed and stable  Post vital signs: Reviewed and stable  Last Vitals:  Vitals Value Taken Time  BP 159/89 09/24/2018 10:00 AM  Temp 36.3 C 09/24/2018 10:00 AM  Pulse 89 09/24/2018 10:01 AM  Resp 11 09/24/2018 10:01 AM  SpO2 100 % 09/24/2018 10:01 AM  Vitals shown include unvalidated device data.  Last Pain:  Vitals:   09/24/18 1000  TempSrc:   PainSc: (P) Asleep         Complications: No apparent anesthesia complications

## 2018-09-24 NOTE — Interval H&P Note (Signed)
History and Physical Interval Note:  09/24/2018 7:23 AM  Jocelyn Wiley  has presented today for surgery, with the diagnosis of HNP L3-4  The various methods of treatment have been discussed with the patient and family. After consideration of risks, benefits and other options for treatment, the patient has consented to  Procedure(s) with comments: LUMBAR DECOMPRESSION L3-4 (N/A) - 120 mins as a surgical intervention .  The patient's history has been reviewed, patient examined, no change in status, stable for surgery.  I have reviewed the patient's chart and labs.  Questions were answered to the patient's satisfaction.     Javier Docker

## 2018-09-24 NOTE — Anesthesia Postprocedure Evaluation (Signed)
Anesthesia Post Note  Patient: Jocelyn Wiley  Procedure(s) Performed: LUMBAR DECOMPRESSION LUMBAR THREE-FOUR (N/A Spine Lumbar)     Patient location during evaluation: PACU Anesthesia Type: General Level of consciousness: awake and alert Pain management: pain level controlled Vital Signs Assessment: post-procedure vital signs reviewed and stable Respiratory status: spontaneous breathing, nonlabored ventilation, respiratory function stable and patient connected to nasal cannula oxygen Cardiovascular status: blood pressure returned to baseline and stable Postop Assessment: no apparent nausea or vomiting Anesthetic complications: no    Last Vitals:  Vitals:   09/24/18 1115 09/24/18 1140  BP: (!) 166/66 (!) 200/87  Pulse: 63 66  Resp: 17 16  Temp: 36.5 C   SpO2: 97% 99%    Last Pain:  Vitals:   09/24/18 1122  TempSrc:   PainSc: 2                  Kennieth RadFitzgerald, Jader Desai E

## 2018-09-24 NOTE — Anesthesia Preprocedure Evaluation (Addendum)
Anesthesia Evaluation  Patient identified by MRN, date of birth, ID band Patient awake    Reviewed: Allergy & Precautions, NPO status , Patient's Chart, lab work & pertinent test results, reviewed documented beta blocker date and time   Airway Mallampati: II  TM Distance: >3 FB Neck ROM: Full    Dental  (+) Dental Advisory Given   Pulmonary asthma ,    breath sounds clear to auscultation       Cardiovascular hypertension, Pt. on medications and Pt. on home beta blockers  Rhythm:Regular Rate:Normal     Neuro/Psych  Headaches,    GI/Hepatic negative GI ROS, Neg liver ROS,   Endo/Other  Hypothyroidism   Renal/GU CRFRenal disease     Musculoskeletal  (+) Arthritis ,   Abdominal   Peds  Hematology  (+) anemia ,   Anesthesia Other Findings   Reproductive/Obstetrics                            Lab Results  Component Value Date   WBC 4.6 09/17/2018   HGB 11.3 (L) 09/17/2018   HCT 35.7 (L) 09/17/2018   MCV 87.3 09/17/2018   PLT 272 09/17/2018   Lab Results  Component Value Date   CREATININE 1.48 (H) 09/17/2018   BUN 8 09/17/2018   NA 137 09/17/2018   K 3.8 09/17/2018   CL 102 09/17/2018   CO2 26 09/17/2018    Anesthesia Physical Anesthesia Plan  ASA: II  Anesthesia Plan: General   Post-op Pain Management:    Induction: Intravenous  PONV Risk Score and Plan: 3 and Dexamethasone, Ondansetron and Treatment may vary due to age or medical condition  Airway Management Planned: Oral ETT  Additional Equipment:   Intra-op Plan:   Post-operative Plan: Extubation in OR  Informed Consent: I have reviewed the patients History and Physical, chart, labs and discussed the procedure including the risks, benefits and alternatives for the proposed anesthesia with the patient or authorized representative who has indicated his/her understanding and acceptance.   Dental advisory given  Plan  Discussed with: CRNA  Anesthesia Plan Comments:         Anesthesia Quick Evaluation

## 2018-09-24 NOTE — Evaluation (Signed)
Physical Therapy Evaluation Patient Details Name: Jocelyn Wiley MRN: [id number] DOB: [birth year] Today's Date: 09/24/2018   History of Present Illness  Patient is a 68 y/o female admitted with SS and HNP at L3-4 now s/p Central decompression at L3-4 with bilateral hemilaminotomies at L3-L4 with bilateral foraminotomies L3-L4, evacuation of disk herniation.  PMH includes, OA, TKA, DDD, CKD, DJD, anemia, asthma, glaucoma and migraines.   Clinical Impression  Patient presents with decreased independence with mobility due to pain and new back precautions.  Feel she will benefit from skilled PT in the acute setting to allow return home with spouse assist.  Will follow up prior to d/c for stair training.     Follow Up Recommendations No PT follow up    Equipment Recommendations  None recommended by PT    Recommendations for Other Services       Precautions / Restrictions Precautions Precautions: Fall;Back Precaution Booklet Issued: Yes (comment)      Mobility  Bed Mobility Overal bed mobility: Needs Assistance Bed Mobility: Rolling;Sidelying to Sit;Sit to Sidelying Rolling: Supervision Sidelying to sit: Min guard       General bed mobility comments: cues for technique, assist for hand placement  Transfers Overall transfer level: Needs assistance Equipment used: Rolling walker (2 wheeled) Transfers: Sit to/from Stand Sit to Stand: Min assist            Ambulation/Gait Ambulation/Gait assistance: Min guard Gait Distance (Feet): 200 Feet Assistive device: Rolling walker (2 wheeled) Gait Pattern/deviations: Step-through pattern;Decreased stride length     General Gait Details: slow pace, assist for balance  Stairs            Wheelchair Mobility    Modified Rankin (Stroke Patients Only)       Balance Overall balance assessment: Needs assistance   Sitting balance-Leahy Scale: Good       Standing balance-Leahy Scale: Fair                                Pertinent Vitals/Pain Pain Assessment: 0-10 Pain Score: 5  Pain Location: surgical pain Pain Descriptors / Indicators: Operative site guarding Pain Intervention(s): Monitored during session;Repositioned    Home Living Family/patient expects to be discharged to:: Private residence Living Arrangements: Spouse/significant other;Children Available Help at Discharge: Family;Available 24 hours/day Type of Home: House Home Access: Stairs to enter Entrance Stairs-Rails: Can reach both;Right;Left Entrance Stairs-Number of Steps: 4 Home Layout: One level Home Equipment: Walker - 2 wheels;Crutches;Electric scooter;Grab bars - tub/shower;Grab bars - toilet;Walker - 4 wheels      Prior Function Level of Independence: Independent with assistive device(s)         Comments: using walker at home     Hand Dominance        Extremity/Trunk Assessment        Lower Extremity Assessment Lower Extremity Assessment: Generalized weakness    Cervical / Trunk Assessment Cervical / Trunk Assessment: Kyphotic  Communication   Communication: No difficulties  Cognition Arousal/Alertness: Awake/alert Behavior During Therapy: WFL for tasks assessed/performed Overall Cognitive Status: Within Functional Limits for tasks assessed                                        General Comments      Exercises     Assessment/Plan    PT Assessment Patient needs continued PT  services  PT Problem List Decreased activity tolerance;Decreased balance;Decreased mobility;Decreased knowledge of precautions;Decreased knowledge of use of DME;Decreased strength       PT Treatment Interventions Gait training;DME instruction;Therapeutic exercise;Functional mobility training;Therapeutic activities;Stair training;Patient/family education    PT Goals (Current goals can be found in the Care Plan section)  Acute Rehab PT Goals Patient Stated Goal: to go home PT Goal Formulation:  With patient/family Time For Goal Achievement: 09/25/18 Potential to Achieve Goals: Good    Frequency Min 5X/week   Barriers to discharge        Co-evaluation               AM-PAC PT "6 Clicks" Mobility  Outcome Measure Help needed turning from your back to your side while in a flat bed without using bedrails?: A Little Help needed moving from lying on your back to sitting on the side of a flat bed without using bedrails?: A Little Help needed moving to and from a bed to a chair (including a wheelchair)?: A Little Help needed standing up from a chair using your arms (e.g., wheelchair or bedside chair)?: A Little Help needed to walk in hospital room?: A Little Help needed climbing 3-5 steps with a railing? : A Little 6 Click Score: 18    End of Session Equipment Utilized During Treatment: Gait belt Activity Tolerance: Patient tolerated treatment well Patient left: in bed;with family/visitor present;with call bell/phone within reach Nurse Communication: Mobility status PT Visit Diagnosis: Difficulty in walking, not elsewhere classified (R26.2)    Time: 1343-1406 PT Time Calculation (min) (ACUTE ONLY): 23 min   Charges:   PT Evaluation $PT Eval Low Complexity: 1 Low PT Treatments $Gait Training: 8-22 mins        Sheran LawlessCyndi , PT Acute Rehabilitation Services [phone] 09/24/2018   Jocelyn Wiley  09/24/2018, 3:52 PM

## 2018-09-24 NOTE — Op Note (Signed)
NAME: Jocelyn Wiley, [name] MEDICAL RECORD [id number] ACCOUNT [id number] DATE OF BIRTH:03/16/1951 FACILITY: [facility] LOCATION: [facility] PHYSICIAN:Nancee Brownrigg Connye Burkitt. Doshie Maggi, MD  OPERATIVE REPORT  DATE OF PROCEDURE:  09/24/2018  PREOPERATIVE DIAGNOSIS:  Spinal stenosis, herniated nucleus pulposus, L3-4.  POSTOPERATIVE DIAGNOSIS:  Spinal stenosis, herniated nucleus pulposus, L3-4.  PROCEDURE PERFORMED:  Central decompression at L3-4 with bilateral hemilaminotomies at L3-L4 with bilateral foraminotomies L3-L4, evacuation of disk herniation.   ANESTHESIA:  General.  SURGEON:  Jene EveryJeffrey Remer Couse, MD  ASSISTANT:  Andrez GrimeJaclyn Bissell, PA  HISTORY:  A 68 year old with bilateral lower extremity radicular pain, left greater than right due to spinal stenosis, multifactorial at L3-4, history of lumbar decompression at 4-5.  A small disk herniation noted as well going caudad.  We discussed  family conservative treatment for microlumbar decompression at L3-L4.  Risks and benefits discussed including bleeding, infection, damage to neurovascular structures, no change in symptoms, worsening symptoms, DVT, PE, anesthetic complications, etc.  TECHNIQUE:  With the patient in supine position after induction of adequate general anesthesia, 2 grams Kefzol, placed prone on the [place] table.  Legs flexed in a sling.  Lumbar region was prepped and draped in the usual sterile fashion.  Two 18-gauge  spinal needles were utilized to localize.  Lumbar region was prepped and draped in the usual sterile fashion.  After 18-gauge needle to localize at the 3-4 interspace confirmed with x-ray, incision was made from above 3 to to below 4.  Subcutaneous  tissue was dissected with electrocautery to achieve hemostasis.  Dorsal lumbar fascia was injected with 0.25% Marcaine with epinephrine, divided in line with skin incision.  Paraspinous muscle elevated from 3-4.  Operating microscope was draped and  brought in the surgical field after the  [facility] retractor was placed.  I confirmed the level with a Kocher above the spinous process of 3.  A very small interlaminar window in almost apposition of the 3 and 4 spinous processes.  They were removed with  a Leksell rongeur.  A very small interlaminar space was noted.  I used a Leksell to remove a portion of the lamina of 3 and 4 bilaterally.  A small micro curette was utilized to detach ligamentum flavum from the cephalad edge of 4 and the caudad edge of  3 centrally and continued cephalad with a 2 mm Kerrison to the point detaching the ligamentum flavum.  I continued cephalad.  I then used a micro curette to detach ligamentum flavum bilaterally and continued hemilaminotomies bilaterally at L3.  I used a  Leksell rongeur to remove a portion of the inferior articulating process of 3.  Following the detachment of the ligamentum flavum cephalad, I also performed hemilaminotomies of 4 bilaterally, protecting the neural elements with a Woodson retractor at  all times.  Then, I decompressed the lateral recess medial border of the pedicle.  I removed the ligamentum flavum from the interspace bilaterally, protecting the neural elements with a Woodson retractor and a neuropatty.  There was severe stenosis,  particularly noted on the left, but also on the right.  I performed foraminotomies of L4 as well.  There was compression out into the foramen of 4, particularly on the left.  Hypertrophic ligamentum was noted and eased over to the left.  Meticulously  developed a plane between the thecal sac and the ligamentum flavum and then removed ligamentum flavum again performing foraminotomies of 3.  Good restoration of the thecal sac.  There was severe central stenosis noted prior to the decompression.  I  obtained  a confirmatory radiograph with a Woodson in the foramen with 3 and 4.  I then returned to the disk space at L3-L4.  It had migrated slightly caudad.  With a Woodson retractor beneath the thecal sac on  the left and the right, I evaluated the  disk.  I evacuated a small piece in the suction.  Following that, there was no compression upon the thecal sac or the nerve roots of 3 and 4 on both sides and a neuro probe passed freely at the foramen of 3 and 4 and above the pedicle of 3.  I felt this  was well decompressed also, on both sides.  I did not enter the disc space.  I felt the decompression particularly posteriorly was satisfactory.  Copious irrigation with antibiotic irrigation.  Inspection revealed no evidence of CSF leakage or active bleeding.  One small patty of thrombin-soaked Gelfoam was placed over the thecal  sac.  Two smaller 2 mm patties of Gelfoam was placed above the lamina of 3.  We meticulously bone waxed all open cancellous surfaces and bipolar cautery was utilized to achieve hemostasis.  I removed McCullough retractor irrigation.  No active bleeding.   I cauterized any vessels noted in the paraspinous musculature.  I then reapproximated the dorsal lumbar fascia with #1 Vicryl interrupted figure-of-eight suture.  I left a small 1 cm opening in the mid portion for any drainage if it occurred.   Subcutaneous 2 loosely approximated and the skin again was approximated with staples.  Wound was dressed sterilely.  She as placed supine on the hospital bed, extubated without difficulty and transported to the recovery room in satisfactory condition.  The patient tolerated the procedure well.  No complications.  Assistant was [employer], [place].  Minimal blood loss.  TN/NUANCE  D:09/24/2018 T:09/24/2018 JOB:[id number]

## 2018-09-25 ENCOUNTER — Encounter (HOSPITAL_COMMUNITY): Payer: Self-pay | Admitting: Specialist

## 2018-09-25 DIAGNOSIS — M48061 Spinal stenosis, lumbar region without neurogenic claudication: Secondary | ICD-10-CM | POA: Diagnosis not present

## 2018-09-25 MED ORDER — POLYETHYLENE GLYCOL 3350 17 G PO PACK: 17.0000 g | PACK | Freq: Every day | ORAL | 1 refills | Status: DC | PRN

## 2018-09-25 MED ORDER — TRAMADOL HCL 50 MG PO TABS: 50.0000 mg | ORAL_TABLET | Freq: Four times a day (QID) | ORAL | 1 refills | Status: AC | PRN

## 2018-09-25 MED ORDER — ASPIRIN EC 81 MG PO TBEC: 81.0000 mg | DELAYED_RELEASE_TABLET | Freq: Every day | ORAL | Status: DC

## 2018-09-25 MED ORDER — DOCUSATE SODIUM 100 MG PO CAPS: 100.0000 mg | ORAL_CAPSULE | Freq: Two times a day (BID) | ORAL | 1 refills | Status: DC

## 2018-09-25 NOTE — Progress Notes (Signed)
Physical Therapy Treatment Patient Details Name: Jocelyn CornVeronica L Wiley MRN: [id number] DOB: 03-22-1951 Today's Date: 09/25/2018    History of Present Illness Patient is a 68 y/o female admitted with SS and HNP at L3-4 now s/p Central decompression at L3-4 with bilateral hemilaminotomies at L3-L4 with bilateral foraminotomies L3-L4, evacuation of disk herniation.  PMH includes, OA, TKA, DDD, CKD, DJD, anemia, asthma, glaucoma and migraines.     PT Comments    Pt progressing towards physical therapy goals. Balance deficits apparent during gait training. Pt appears unsteady and is taking several lateral steps to maintain dynamic balance. Pt educated on car transfer, precautions, activity progression. Will continue to follow.    Follow Up Recommendations  No PT follow up     Equipment Recommendations  None recommended by PT    Recommendations for Other Services       Precautions / Restrictions Precautions Precautions: Back Precaution Booklet Issued: Yes (comment) Precaution Comments: reviewed back precautions related to ADL and IADL Restrictions Weight Bearing Restrictions: No    Mobility  Bed Mobility Overal bed mobility: Modified Independent Bed Mobility: Rolling;Sidelying to Sit;Sit to Sidelying           General bed mobility comments: No assist. HOB flat and rails lowered to simulate home environment.   Transfers Overall transfer level: Modified independent Equipment used: None Transfers: Sit to/from Stand              Ambulation/Gait Ambulation/Gait assistance: Supervision Gait Distance (Feet): 400 Feet Assistive device: None Gait Pattern/deviations: Step-through pattern;Decreased stride length Gait velocity: Decreased Gait velocity interpretation: 1.31 - 2.62 ft/sec, indicative of limited community ambulator General Gait Details: Pt appears unsteady at times and takes a side step to maintain balance. No assist required to recover however supervision provided  for safety. Pt attributing balance deficits to pain medication.    Stairs Stairs: Yes Stairs assistance: Supervision Stair Management: One rail Right;Step to pattern;Forwards Number of Stairs: 10 General stair comments: VC's for sequencing and general safety. Pt completed flight without difficulty.    Wheelchair Mobility    Modified Rankin (Stroke Patients Only)       Balance Overall balance assessment: Needs assistance Sitting-balance support: Feet supported Sitting balance-Leahy Scale: Good     Standing balance support: No upper extremity supported;During functional activity Standing balance-Leahy Scale: Fair Standing balance comment: Unsteady                            Cognition Arousal/Alertness: Awake/alert Behavior During Therapy: WFL for tasks assessed/performed Overall Cognitive Status: Within Functional Limits for tasks assessed                                        Exercises      General Comments        Pertinent Vitals/Pain Pain Assessment: Faces Faces Pain Scale: Hurts a little bit Pain Location: surgical pain Pain Descriptors / Indicators: Operative site guarding Pain Intervention(s): Monitored during session    Home Living Family/patient expects to be discharged to:: Private residence Living Arrangements: Spouse/significant other;Children Available Help at Discharge: Family;Available 24 hours/day Type of Home: House Home Access: Stairs to enter Entrance Stairs-Rails: Can reach both;Right;Left Home Layout: One level Home Equipment: Walker - 2 wheels;Crutches;Electric scooter;Grab bars - tub/shower;Grab bars - toilet;Walker - 4 wheels;Hand held shower head      Prior Function Level of  Independence: Independent with assistive device(s)      Comments: using walker at home   PT Goals (current goals can now be found in the care plan section) Acute Rehab PT Goals Patient Stated Goal: to go home PT Goal Formulation:  With patient/family Time For Goal Achievement: 09/25/18 Potential to Achieve Goals: Good Progress towards PT goals: Progressing toward goals    Frequency    Min 5X/week      PT Plan Current plan remains appropriate    Co-evaluation              AM-PAC PT "6 Clicks" Mobility   Outcome Measure  Help needed turning from your back to your side while in a flat bed without using bedrails?: None Help needed moving from lying on your back to sitting on the side of a flat bed without using bedrails?: None Help needed moving to and from a bed to a chair (including a wheelchair)?: None Help needed standing up from a chair using your arms (e.g., wheelchair or bedside chair)?: None Help needed to walk in hospital room?: A Little Help needed climbing 3-5 steps with a railing? : A Little 6 Click Score: 22    End of Session Equipment Utilized During Treatment: Gait belt Activity Tolerance: Patient tolerated treatment well Patient left: with call bell/phone within reach(Sitting EOB) Nurse Communication: Mobility status PT Visit Diagnosis: Difficulty in walking, not elsewhere classified (R26.2)     Time: 2800-3491 PT Time Calculation (min) (ACUTE ONLY): 19 min  Charges:  $Gait Training: 8-22 mins                     Jocelyn Wiley, PT, DPT Acute Rehabilitation Services Pager: [phone] Office: [phone]    Jocelyn Wiley 09/25/2018, 11:54 AM

## 2018-09-25 NOTE — Discharge Summary (Signed)
Physician Discharge Summary   Patient ID: Jocelyn Wiley MRN: [id number] DOB/AGE: 1951-05-27 68 y.o.  Admit date: 09/24/2018 Discharge date: 09/25/2018  Primary Diagnosis:   HNP L3-4  Admission Diagnoses:  Past Medical History:  Diagnosis Date  . Anemia   . Anxiety    Hx of panic attacks-none in recent years- (09/17/2018)  . Asthma    seasonal  . CKD (chronic kidney disease), stage I   . DDD (degenerative disc disease), lumbar   . 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 Glaucoma   . Heart murmur   . Hypertension   . Hypothyroidism   . Lumbar disc herniation   . Migraines   . OA (osteoarthritis)   . Polymyalgia rheumatica (HCC)   . PONV (postoperative nausea and vomiting)   . Shingles 2018   current 05/20/18 right leg no ulcerations   Discharge Diagnoses:   Active Problems:   Spinal stenosis, lumbar  Procedure:  Procedure(s) (LRB): LUMBAR DECOMPRESSION LUMBAR THREE-FOUR (N/A)   Consults: None  HPI:  see H&P    Laboratory Data: Hospital Outpatient Visit on 09/17/2018  Component Date Value Ref Range Status  . MRSA, PCR 09/17/2018 NEGATIVE  NEGATIVE Final  . Staphylococcus aureus 09/17/2018 NEGATIVE  NEGATIVE Final   Comment: (NOTE) The Xpert SA Assay (FDA approved for NASAL specimens in patients 14 years of age and older), is one component of a comprehensive surveillance program. It is not intended to diagnose infection nor to guide or monitor treatment. Performed at [facility], [address] [place], [place] [address]   . Sodium 09/17/2018 137  135 - 145 mmol/L Final  . Potassium 09/17/2018 3.8  3.5 - 5.1 mmol/L Final  . Chloride 09/17/2018 102  98 - 111 mmol/L Final  . CO2 09/17/2018 26  22 - 32 mmol/L Final  . Glucose, Bld 09/17/2018 100* 70 - 99 mg/dL Final  . BUN 07/37/1062 8  8 - 23 mg/dL Final  . Creatinine, Ser 09/17/2018 1.48* 0.44 - 1.00 mg/dL Final  . Calcium 69/48/5462 9.3  8.9 - 10.3 mg/dL Final  . GFR calc non Af  Amer 09/17/2018 36* >60 mL/min Final  . GFR calc Af Amer 09/17/2018 42* >60 mL/min Final  . Anion gap 09/17/2018 9  5 - 15 Final   Performed at [facility], [address] [place], [place] [address]  . WBC 09/17/2018 4.6  4.0 - 10.5 K/uL Final  . RBC 09/17/2018 4.09  3.87 - 5.11 MIL/uL Final  . Hemoglobin 09/17/2018 11.3* 12.0 - 15.0 g/dL Final  . HCT 09/38/1829 35.7* 36.0 - 46.0 % Final  . MCV 09/17/2018 87.3  80.0 - 100.0 fL Final  . MCH 09/17/2018 27.6  26.0 - 34.0 pg Final  . MCHC 09/17/2018 31.7  30.0 - 36.0 g/dL Final  . RDW 93/71/6967 12.8  11.5 - 15.5 % Final  . Platelets 09/17/2018 272  150 - 400 K/uL Final  . nRBC 09/17/2018 0.0  0.0 - 0.2 % Final   Performed at [facility], [address], [place], [place] [address]  . Transfuse no blood products 09/17/2018    Final                   Value:TRANSFUSE NO BLOOD PRODUCTS, VERIFIED BY Jewel Baize Performed at [facility], [address] [place], [place] [address]    No results for input(s): HGB in the last 72 hours. No  results for input(s): WBC, RBC, HCT, PLT in the last 72 hours. No results for input(s): NA, K, CL, CO2, BUN, CREATININE, GLUCOSE, CALCIUM in the last 72 hours. No results for input(s): LABPT, INR in the last 72 hours.  X-Rays:Dg Lumbar Spine 2-3 Views  Result Date: 09/17/2018 CLINICAL DATA:  Preoperative evaluation prior to L3-4 decompression due to disc herniation. EXAM: LUMBAR SPINE - 2-3 VIEW COMPARISON:  06/17/2016. FINDINGS: Examination was obtained with patient standing. 5 non-rib-bearing lumbar vertebrae with anatomic alignment. No fractures. Severe disc space narrowing at L5-S1, unchanged. Moderate disc space narrowing at L4-5, unchanged. Mild disc space narrowing at L3-4, progressive since the prior examination. Facet degenerative changes at L4-5 and L5-S1, unchanged. Sacroiliac joints intact. IMPRESSION: Severe degenerative disc disease at L5-S1 and moderate degenerative disc disease at  L4-5, unchanged. Moderate degenerative disc disease at L3-4, progressive since 2017. Electronically Signed   By: Hulan Saas M.D.   On: 09/17/2018 14:01   Dg Lumbar Spine Complete  Result Date: 09/24/2018 CLINICAL DATA:  L3-L4 decompensation. EXAM: LUMBAR SPINE - COMPLETE 4+ VIEW COMPARISON:  09/17/2018 FINDINGS: Three cross-table intraoperative lateral radiographs demonstrate placement of surgical instruments at the level of L3-L4. There has been resection of the posterior elements. The alignment is normal. Two linear metallic structures are seen overlying the sacrum. IMPRESSION: Intraoperative radiographs demonstrating surgical instruments at L3-L4. Electronically Signed   By: Ted Mcalpine M.D.   On: 09/24/2018 10:38    EKG: Orders placed or performed during the hospital encounter of 05/12/18  . EKG 12 lead  . EKG 12 lead     Hospital Course: Patient was admitted to [facility] and taken to the OR and underwent the above state procedure without complications.  Patient tolerated the procedure well and was later transferred to the recovery room and then to the orthopaedic floor for postoperative care.  They were given PO and IV analgesics for pain control following their surgery.  They were given 24 hours of postoperative antibiotics.   PT was consulted postop to assist with mobility and transfers.  The patient was allowed to be WBAT with therapy and was taught back precautions. Discharge planning was consulted to help with postop disposition and equipment needs.  Patient had a good night on the evening of surgery and started to get up OOB with therapy on day one. Patient was seen in rounds and was ready to go home on day one.  They were given discharge instructions and dressing directions.  They were instructed on when to follow up in the office with Dr. Shelle Iron.   Diet: Regular diet Activity:WBAT, Lspine precautions Follow-up:in 10-14 days Disposition - Home Discharged  Condition: good   Discharge Instructions    Call MD / Call 911   Complete by:  As directed    If you experience chest pain or shortness of breath, CALL 911 and be transported to the hospital emergency room.  If you develope a fever above 101 F, pus (white drainage) or increased drainage or redness at the wound, or calf pain, call your surgeon's office.   Constipation Prevention   Complete by:  As directed    Drink plenty of fluids.  Prune juice may be helpful.  You may use a stool softener, such as Colace (over the counter) 100 mg twice a day.  Use MiraLax (over the counter) for constipation as needed.   Diet - low sodium heart healthy   Complete by:  As directed    Increase activity  slowly as tolerated   Complete by:  As directed      Allergies as of 09/25/2018      Reactions   Hydrocodone Nausea And Vomiting   Caffeine Palpitations      Medication List    STOP taking these medications   HYDROmorphone 2 MG tablet Commonly known as:  DILAUDID     TAKE these medications   aspirin EC 81 MG tablet Take 1 tablet (81 mg total) by mouth daily. Resume 4 days post-op What changed:    additional instructions  Another medication with the same name was removed. Continue taking this medication, and follow the directions you see here.   docusate sodium 100 MG capsule Commonly known as:  COLACE Take 1 capsule (100 mg total) by mouth 2 (two) times daily.   gabapentin 100 MG capsule Commonly known as:  NEURONTIN Take 100 mg by mouth daily as needed (neuropathy). What changed:  Another medication with the same name was removed. Continue taking this medication, and follow the directions you see here.   ipratropium 0.06 % nasal spray Commonly known as:  ATROVENT Place 2 sprays into both nostrils 4 (four) times daily as needed for allergies.   levothyroxine 75 MCG tablet Commonly known as:  SYNTHROID, LEVOTHROID Take 75 mcg by mouth daily before breakfast.   metoprolol succinate 25  MG 24 hr tablet Commonly known as:  TOPROL-XL Take 25 mg by mouth daily.   ondansetron 4 MG tablet Commonly known as:  ZOFRAN Take 1 tablet (4 mg total) by mouth every 6 (six) hours as needed for nausea or vomiting.   polyethylene glycol packet Commonly known as:  MIRALAX / GLYCOLAX Take 17 g by mouth daily as needed for mild constipation.   PREMARIN 0.9 MG tablet Generic drug:  estrogens (conjugated) Take 0.9 mg by mouth daily.   PROAIR HFA 108 (90 Base) MCG/ACT inhaler Generic drug:  albuterol Inhale 2 puffs into the lungs every 4 (four) hours as needed for wheezing or shortness of breath.   timolol 0.5 % ophthalmic solution Commonly known as:  TIMOPTIC Place 1 drop into both eyes 2 (two) times daily.   topiramate 25 MG tablet Commonly known as:  TOPAMAX Take 25 mg by mouth daily as needed (for migraines).   traMADol 50 MG tablet Commonly known as:  ULTRAM Take 1 tablet (50 mg total) by mouth every 6 (six) hours as needed for moderate pain (pain.).   triamterene-hydrochlorothiazide 37.5-25 MG tablet Commonly known as:  MAXZIDE-25 Take 1 tablet by mouth every other day.   vitamin B-12 500 MCG tablet Commonly known as:  CYANOCOBALAMIN Take 500 mcg by mouth daily.   vitamin C 500 MG tablet Commonly known as:  ASCORBIC ACID Take 500 mg by mouth daily.      Follow-up Information    Jene EveryBeane, Jeffrey, MD Follow up in 2 week(s).   Specialty:  Orthopedic Surgery Contact information: [address] [place] [id number] [phone]           Signed: Andrez GrimeJaclyn [place], PA-C Orthopaedic Surgery 09/25/2018, 8:01 AM

## 2018-09-25 NOTE — Progress Notes (Signed)
Subjective: 1 Day Post-Op Procedure(s) (LRB): LUMBAR DECOMPRESSION LUMBAR THREE-FOUR (N/A) Patient reports pain as mild.  Reports incisional soreness, no spasms. No nerve pain. Tolerating walking well. Tramadol working well for pain. Voiding without difficulty. + flatus no BM yet. Feels ready for D/c home. No other c/o.  Objective: Vital signs in last 24 hours: Temp:  [97.3 F (36.3 C)-98 F (36.7 C)] 97.9 F (36.6 C) (01/10 0348) Pulse Rate:  [63-74] 69 (01/10 0348) Resp:  [12-18] 18 (01/10 0348) BP: (135-200)/(64-89) 135/64 (01/10 0348) SpO2:  [97 %-100 %] 99 % (01/10 0348)  Intake/Output from previous day: 01/09 0701 - 01/10 0700 In: 1480 [P.O.:480; I.V.:900; IV Piggyback:100] Out: 900 [Urine:800; Blood:100] Intake/Output this shift: No intake/output data recorded.  No results for input(s): HGB in the last 72 hours. No results for input(s): WBC, RBC, HCT, PLT in the last 72 hours. No results for input(s): NA, K, CL, CO2, BUN, CREATININE, GLUCOSE, CALCIUM in the last 72 hours. No results for input(s): LABPT, INR in the last 72 hours.  Neurologically intact ABD soft Neurovascular intact Sensation intact distally Intact pulses distally Dorsiflexion/Plantar flexion intact Incision: dressing C/D/I and no drainage No cellulitis present Compartment soft no calf pain or sign of DVT  Assessment/Plan: 1 Day Post-Op Procedure(s) (LRB): LUMBAR DECOMPRESSION LUMBAR THREE-FOUR (N/A) Advance diet Up with therapy D/C IV fluids Discussed LSpine precautions, D/c instructions, dressing instructions D/C home today Will discuss with Dr. Elissa Lovett, Dayna Barker. 09/25/2018, 7:58 AM

## 2018-09-25 NOTE — Evaluation (Signed)
Occupational Therapy Evaluation and Discharge Patient Details Name: Jocelyn CornVeronica L Wiley MRN: [id number] DOB: August 15, 1951 Today's Date: 09/25/2018    History of Present Illness Patient is a 68 y/o female admitted with SS and HNP at L3-4 now s/p Central decompression at L3-4 with bilateral hemilaminotomies at L3-L4 with bilateral foraminotomies L3-L4, evacuation of disk herniation.  PMH includes, OA, TKA, DDD, CKD, DJD, anemia, asthma, glaucoma and migraines.    Clinical Impression   Pt ambulating in room upon arrival. Educated in back precautions at length during ADL and IADL with pt verbalizing and/or demonstrating understanding. Pt feeling much better than prior to surgery. Will have husband available to assist as needed at home. No further OT needs.    Follow Up Recommendations  No OT follow up    Equipment Recommendations  None recommended by OT    Recommendations for Other Services       Precautions / Restrictions Precautions Precautions: Back Precaution Comments: reviewed back precautions related to ADL and IADL Restrictions Weight Bearing Restrictions: No      Mobility Bed Mobility Overal bed mobility: Modified Independent             General bed mobility comments: used log roll technique  Transfers Overall transfer level: Modified independent Equipment used: None                  Balance                                           ADL either performed or assessed with clinical judgement   ADL Overall ADL's : Modified independent                                       General ADL Comments: Educated pt in ADL and IADL adhering to back precautions.      Vision Baseline Vision/History: Wears glasses Wears Glasses: At all times Patient Visual Report: No change from baseline       Perception     Praxis      Pertinent Vitals/Pain Pain Assessment: Faces Faces Pain Scale: Hurts a little bit Pain Location: surgical  pain Pain Descriptors / Indicators: Operative site guarding Pain Intervention(s): Monitored during session     Hand Dominance Right   Extremity/Trunk Assessment Upper Extremity Assessment Upper Extremity Assessment: Overall WFL for tasks assessed   Lower Extremity Assessment Lower Extremity Assessment: Defer to PT evaluation       Communication Communication Communication: No difficulties   Cognition Arousal/Alertness: Awake/alert Behavior During Therapy: WFL for tasks assessed/performed Overall Cognitive Status: Within Functional Limits for tasks assessed                                     General Comments       Exercises     Shoulder Instructions      Home Living Family/patient expects to be discharged to:: Private residence Living Arrangements: Spouse/significant other;Children Available Help at Discharge: Family;Available 24 hours/day Type of Home: House Home Access: Stairs to enter Entergy CorporationEntrance Stairs-Number of Steps: 4 Entrance Stairs-Rails: Can reach both;Right;Left Home Layout: One level     Bathroom Shower/Tub: Walk-in shower;Tub/shower unit   Bathroom Toilet: Handicapped height     Home  Equipment: Walker - 2 wheels;Crutches;Electric scooter;Grab bars - tub/shower;Grab bars - toilet;Walker - 4 wheels;Hand held shower head          Prior Functioning/Environment Level of Independence: Independent with assistive device(s)        Comments: using walker at home        OT Problem List:        OT Treatment/Interventions:      OT Goals(Current goals can be found in the care plan section) Acute Rehab OT Goals Patient Stated Goal: to go home  OT Frequency:     Barriers to D/C:            Co-evaluation              AM-PAC OT "6 Clicks" Daily Activity     Outcome Measure Help from another person eating meals?: None Help from another person taking care of personal grooming?: None Help from another person toileting, which  includes using toliet, bedpan, or urinal?: None Help from another person bathing (including washing, rinsing, drying)?: None Help from another person to put on and taking off regular upper body clothing?: None Help from another person to put on and taking off regular lower body clothing?: None 6 Click Score: 24   End of Session    Activity Tolerance: Patient tolerated treatment well Patient left: in bed;with call bell/phone within reach  OT Visit Diagnosis: Pain                Time: [id number] OT Time Calculation (min): 13 min Charges:  OT General Charges $OT Visit: 1 Visit OT Evaluation $OT Eval Low Complexity: 1 Low  Martie RoundJulie Johnetta Sloniker, OTR/L Acute Rehabilitation Services Pager: [phone] Office: [phone]  Evern BioMayberry, Hosea Hanawalt Lynn 09/25/2018, 9:22 AM

## 2018-09-25 NOTE — Progress Notes (Signed)
Patient alert and oriented, mae's well, voiding adequate amount of urine, swallowing without difficulty, no c/o pain at time of discharge. Patient discharged home with family. Script and discharged instructions given to patient. Patient and family stated understanding of instructions given. Patient has an appointment with Dr. Beane ?

## 2020-03-03 ENCOUNTER — Ambulatory Visit: Payer: Self-pay | Admitting: Orthopedic Surgery

## 2020-03-24 NOTE — Progress Notes (Signed)
[facility] [address] [place], [place] - [address] [place] [address] Phone: [phone] Fax: [phone]  [id number] - [place], [place] - [address] [place] [address] Phone: [phone] Fax: [phone]    Your procedure is scheduled on Thursday, July 15th.  Report to [facility] Main Entrance "A" at 5:30 A.M., and check in at the Admitting office.  Call this number if you have problems the morning of surgery:  [phone]  Call [phone] if you have any questions prior to your surgery date Monday-Friday 8am-4pm   Remember:  Do not eat after midnight the night before your surgery  You may drink clear liquids until 4:30A.M. the morning of your surgery.   Clear liquids allowed are: Water, Non-Citrus Juices (without pulp), Carbonated Beverages, Clear Tea, Black Coffee Only, and Gatorade  Please complete your PRE-SURGERY ENSURE that was provided to you 4:30 A.M. the morning of surgery.  Please, if able, drink it in one setting. DO NOT SIP.    Take these medicines the morning of surgery with A SIP OF WATER  levothyroxine (SYNTHROID, LEVOTHROID) metoprolol succinate (TOPROL-XL) PREMARIN   If needed - fluticasone (FLONASE)/nasal spray, gabapentin (NEURONTIN), ipratropium (ATROVENT)/nasal spray, ondansetron (ZOFRAN), oxyCODONE-acetaminophen (PERCOCET/ROXICET), PROAIR HFA/inhaler, timolol (TIMOPTIC)/eye drops, topiramate (TOPAMAX), traMADol (ULTRAM)       Follow your surgeon's instructions on when to stop Aspirin.  If no instructions were given by your surgeon then you will need to call the office to get those instructions.    As of today, STOP taking Aleve, Naproxen, Ibuprofen, Motrin, Advil, Goody's, BC's, all herbal medications, fish oil, and all vitamins.             Do not wear jewelry, make up, or nail polish            Do not wear lotions, powders, perfumes, or  deodorant.            Do not shave 48 hours prior to surgery.              Do not bring valuables to the hospital.            [facility] is not responsible for any belongings or valuables.  Do NOT Smoke (Tobacco/Vaping) or drink Alcohol 24 hours prior to your procedure If you use a CPAP at night, you may bring all equipment for your overnight stay.   Contacts, glasses, dentures or bridgework may not be worn into surgery.      For patients admitted to the hospital, discharge time will be determined by your treatment team.   Patients discharged the day of surgery will not be allowed to drive home, and someone needs to stay with them for 24 hours.  Special instructions:   [facility]- Preparing For Surgery  Before surgery, you can play an important role. Because skin is not sterile, your skin needs to be as free of germs as possible. You can reduce the number of germs on your skin by washing with CHG (chlorahexidine gluconate) Soap before surgery.  CHG is an antiseptic cleaner which kills germs and bonds with the skin to continue killing germs even after washing.    Oral Hygiene is also important to reduce your risk of infection.  Remember - BRUSH YOUR TEETH THE MORNING OF SURGERY WITH YOUR REGULAR TOOTHPASTE  Please do not use if you have an allergy to CHG or antibacterial soaps.  If your skin becomes reddened/irritated stop using the CHG.  Do not shave (including legs and underarms) for at least 48 hours prior to first CHG shower. It is OK to shave your face.  Please follow these instructions carefully.   1. Shower the NIGHT BEFORE SURGERY and the MORNING OF SURGERY with CHG Soap.   2. If you chose to wash your hair, wash your hair first as usual with your normal shampoo.  3. After you shampoo, rinse your hair and body thoroughly to remove the shampoo.  4. Use CHG as you would any other liquid soap. You can apply CHG directly to the skin and wash gently with a scrungie or a clean  washcloth.   5. Apply the CHG Soap to your body ONLY FROM THE NECK DOWN.  Do not use on open wounds or open sores. Avoid contact with your eyes, ears, mouth and genitals (private parts). Wash Face and genitals (private parts)  with your normal soap.   6. Wash thoroughly, paying special attention to the area where your surgery will be performed.  7. Thoroughly rinse your body with warm water from the neck down.  8. DO NOT shower/wash with your normal soap after using and rinsing off the CHG Soap.  9. Pat yourself dry with a CLEAN TOWEL.  10. Wear CLEAN PAJAMAS to bed the night before surgery  11. Place CLEAN SHEETS on your bed the night of your first shower and DO NOT SLEEP WITH PETS.  Day of Surgery: Wear Clean/Comfortable clothing the morning of surgery Do not apply any deodorants/lotions.   Remember to brush your teeth WITH YOUR REGULAR TOOTHPASTE.   Please read over the following fact sheets that you were given.

## 2020-03-27 ENCOUNTER — Other Ambulatory Visit (HOSPITAL_COMMUNITY)
Admission: RE | Admit: 2020-03-27 | Discharge: 2020-03-27 | Disposition: A | Discharge status: Home / Self Care | Payer: Medicare Other | Source: Ambulatory Visit | Attending: Orthopedic Surgery | Admitting: Orthopedic Surgery

## 2020-03-27 ENCOUNTER — Encounter (HOSPITAL_COMMUNITY): Payer: Self-pay

## 2020-03-27 ENCOUNTER — Encounter (HOSPITAL_COMMUNITY)
Admission: RE | Admit: 2020-03-27 | Discharge: 2020-03-27 | Disposition: A | Discharge status: Home / Self Care | Payer: Medicare Other | Source: Ambulatory Visit | Attending: Specialist | Admitting: Specialist

## 2020-03-27 ENCOUNTER — Other Ambulatory Visit: Payer: Self-pay

## 2020-03-27 ENCOUNTER — Ambulatory Visit (HOSPITAL_COMMUNITY)
Admission: RE | Admit: 2020-03-27 | Discharge: 2020-03-27 | Disposition: A | Discharge status: Home / Self Care | Payer: Medicare Other | Source: Ambulatory Visit | Attending: Orthopedic Surgery | Admitting: Orthopedic Surgery

## 2020-03-27 DIAGNOSIS — M5136 Other intervertebral disc degeneration, lumbar region: Secondary | ICD-10-CM | POA: Diagnosis not present

## 2020-03-27 DIAGNOSIS — Z20822 Contact with and (suspected) exposure to covid-19: Secondary | ICD-10-CM | POA: Insufficient documentation

## 2020-03-27 DIAGNOSIS — Z01818 Encounter for other preprocedural examination: Secondary | ICD-10-CM | POA: Diagnosis not present

## 2020-03-27 DIAGNOSIS — M5126 Other intervertebral disc displacement, lumbar region: Secondary | ICD-10-CM | POA: Insufficient documentation

## 2020-03-27 HISTORY — DX: Cardiac arrhythmia, unspecified: I49.9

## 2020-03-27 HISTORY — DX: Gastro-esophageal reflux disease without esophagitis: K21.9

## 2020-03-27 HISTORY — DX: Personal history of other diseases of the digestive system: Z87.19

## 2020-03-27 LAB — CBC
HCT: 36.2 % (ref 36.0–46.0)
Hemoglobin: 11.5 g/dL — ABNORMAL LOW (ref 12.0–15.0)
MCH: 27.8 pg (ref 26.0–34.0)
MCHC: 31.8 g/dL (ref 30.0–36.0)
MCV: 87.4 fL (ref 80.0–100.0)
Platelets: 269 10*3/uL (ref 150–400)
RBC: 4.14 MIL/uL (ref 3.87–5.11)
RDW: 13.2 % (ref 11.5–15.5)
WBC: 6.8 10*3/uL (ref 4.0–10.5)
nRBC: 0 % (ref 0.0–0.2)

## 2020-03-27 LAB — BASIC METABOLIC PANEL
Anion gap: 9 (ref 5–15)
BUN: 14 mg/dL (ref 8–23)
CO2: 28 mmol/L (ref 22–32)
Calcium: 9.3 mg/dL (ref 8.9–10.3)
Chloride: 97 mmol/L — ABNORMAL LOW (ref 98–111)
Creatinine, Ser: 1.13 mg/dL — ABNORMAL HIGH (ref 0.44–1.00)
GFR calc Af Amer: 57 mL/min — ABNORMAL LOW (ref >60)
GFR calc non Af Amer: 49 mL/min — ABNORMAL LOW (ref >60)
Glucose, Bld: 89 mg/dL (ref 70–99)
Potassium: 3.9 mmol/L (ref 3.5–5.1)
Sodium: 134 mmol/L — ABNORMAL LOW (ref 135–145)

## 2020-03-27 LAB — SURGICAL PCR SCREEN
MRSA, PCR: NEGATIVE
Staphylococcus aureus: NEGATIVE

## 2020-03-27 LAB — SARS CORONAVIRUS 2 (TAT 6-24 HRS): SARS Coronavirus 2: NEGATIVE

## 2020-03-27 NOTE — Progress Notes (Signed)
PCP - Soyla Dryer. Aundria Rud, PA-C Cardiologist - Willette Pa, MD  PPM/ICD - Denies  Lumbar Spine x-ray - 03/27/20 EKG - 11/30/19 (C.E); tracing requested Stress Test - 04/28/18 (C.E.) ECHO - 04/28/18 (C.E.) Cardiac Cath - Denies  Sleep Study - Denies  Patient denies being diabetic.  Blood Thinner Instructions: N/A Aspirin Instructions: Per patient, last dose 04/02/20  ERAS Protcol - Yes PRE-SURGERY Ensure or G2- Ensure given  COVID TEST- 03/27/20   Anesthesia review: Yes, Review ECHO, Stress test and EKG tracing  Patient denies shortness of breath, fever, cough and chest pain at PAT appointment   All instructions explained to the patient, with a verbal understanding of the material. Patient agrees to go over the instructions while at home for a better understanding. Patient also instructed to self quarantine after being tested for COVID-19. The opportunity to ask questions was provided.

## 2020-03-28 ENCOUNTER — Ambulatory Visit: Payer: Self-pay | Admitting: Orthopedic Surgery

## 2020-03-28 NOTE — H&P (View-Only) (Signed)
Jocelyn Wiley is an 69 y.o. female.   Chief Complaint: back and right leg pain HPI: Reported by patient. Reason for Visit: low back Context: The patient is years out from the onset of symptoms Location (Lower Extremity): lower back pain ; leg pain on the right, , Severity: pain level 3.5/10 Quality: sharp Aggravating Factors: standing for long periods of time; walking for Associated Symptoms: numbness/tingling; weakness (RLE) Medications: The patient is taking Mobic. She takes Gabapentin as needed. minimal help from her injection his radiate down into her big toe.  I reviewed Dr. Ethelene Hal as noted was technically successful.  Past Medical History:  Diagnosis Date   Anemia    Anxiety    Hx of panic attacks-none in recent years- (09/17/2018)   Asthma    seasonal   CKD (chronic kidney disease), stage I    DDD (degenerative disc disease), lumbar    DJD (degenerative joint disease) of knee    Right   Dysrhythmia    tachycardia   GERD (gastroesophageal reflux disease)    Glaucoma    Heart murmur    History of hiatal hernia    Hypertension    Hypothyroidism    Lumbar disc herniation    Migraines    OA (osteoarthritis)    Polymyalgia rheumatica (HCC)    PONV (postoperative nausea and vomiting)    Shingles 2018   current 05/20/18 right leg no ulcerations    Past Surgical History:  Procedure Laterality Date   APPENDECTOMY     BACK SURGERY     lumbar - 1997ish   CHOLECYSTECTOMY     COLONOSCOPY     JOINT REPLACEMENT Bilateral    knees   LUMBAR LAMINECTOMY/DECOMPRESSION MICRODISCECTOMY N/A 09/24/2018   Procedure: LUMBAR DECOMPRESSION LUMBAR THREE-FOUR;  Surgeon: Jene Every, MD;  Location: [facility];  Service: Orthopedics;  Laterality: N/A;  posterior   milk duct removal Right 1970s   THYROIDECTOMY, PARTIAL     TONSILLECTOMY     TOTAL ABDOMINAL HYSTERECTOMY     TOTAL KNEE ARTHROPLASTY Right    TOTAL KNEE ARTHROPLASTY Left 05/20/2018   Procedure:  LEFT TOTAL KNEE ARTHROPLASTY;  Surgeon: Jene Every, MD;  Location: [facility];  Service: Orthopedics;  Laterality: Left;  120 mins    No family history on file. Social History:  reports that she has never smoked. She has never used smokeless tobacco. She reports previous alcohol use. She reports that she does not use drugs.  Allergies:  Allergies  Allergen Reactions   Almond Oil Anaphylaxis   Carvedilol Other (See Comments)    VISUAL DISTURBANCE   Darvon [Propoxyphene]     Felt high    Hydrochlorothiazide Other (See Comments)    cramps   Hydrocodone Nausea And Vomiting   Nsaids     Upset stomach   Amlodipine Other (See Comments)    Upset stomach   Caffeine Palpitations   Cetirizine Other (See Comments)    Symptoms of RLS   Cyclobenzaprine Other (See Comments)    HYPERSOMNOLENCE   Lisinopril Cough   Losartan Other (See Comments)    Muscle cramps   Prednisone Palpitations and Other (See Comments)   Tizanidine Other (See Comments)    Unknown reaction     Medications: ascorbic acid (vitamin C) 500 mg tablet aspirin 81 mg tablet,delayed release cyanocobalamin (vit B-12) 500 mcg tablet EC-Naproxen 500 mg tablet,delayed release fluticasone propionate 50 mcg/actuation nasal spray,suspension gabapentin levothyroxine 75 mcg tablet loratadine 10 mg tablet meloxicam  metoprolol succinate ER 25  mg tablet,extended release 24 hr ondansetron HCL 4 mg tablet oxyCODONE-acetaminophen 5 mg-325 mg tablet Premarin 0.9 mg tablet ProAir HFA 90 mcg/actuation aerosol inhaler timoloL maleate 0.5 % eye drops traMADoL 50 mg tablet traZODone 100 mg tablet triamterene 37.5 mg-hydrochlorothiazide 25 mg tablet  Results for orders placed or performed during the hospital encounter of 03/27/20 (from the past 48 hour(s))  Basic metabolic panel     Status: Abnormal   Collection Time: 03/27/20  1:40 PM  Result Value Ref Range   Sodium 134 (L) 135 - 145 mmol/L   Potassium 3.9 3.5 -  5.1 mmol/L   Chloride 97 (L) 98 - 111 mmol/L   CO2 28 22 - 32 mmol/L   Glucose, Bld 89 70 - 99 mg/dL    Comment: Glucose reference range applies only to samples taken after fasting for at least 8 hours.   BUN 14 8 - 23 mg/dL   Creatinine, Ser 1.611.13 (H) 0.44 - 1.00 mg/dL   Calcium 9.3 8.9 - 09.610.3 mg/dL   GFR calc non Af Amer 49 (L) >60 mL/min   GFR calc Af Amer 57 (L) >60 mL/min   Anion gap 9 5 - 15    Comment: Performed at [facility], [address] [place], [place] [address]  CBC     Status: Abnormal   Collection Time: 03/27/20  1:40 PM  Result Value Ref Range   WBC 6.8 4.0 - 10.5 K/uL   RBC 4.14 3.87 - 5.11 MIL/uL   Hemoglobin 11.5 (L) 12.0 - 15.0 g/dL   HCT 98.136.2 36 - 46 %   MCV 87.4 80.0 - 100.0 fL   MCH 27.8 26.0 - 34.0 pg   MCHC 31.8 30.0 - 36.0 g/dL   RDW 19.113.2 47.811.5 - 29.515.5 %   Platelets 269 150 - 400 K/uL   nRBC 0.0 0.0 - 0.2 %    Comment: Performed at [facility], [address] [place], [place] [address]  Surgical pcr screen     Status: None   Collection Time: 03/27/20  1:45 PM   Specimen: Nasal Mucosa; Nasal Swab  Result Value Ref Range   MRSA, PCR NEGATIVE NEGATIVE   Staphylococcus aureus NEGATIVE NEGATIVE    Comment: (NOTE) The Xpert SA Assay (FDA approved for NASAL specimens in patients 70 years of age and older), is one component of a comprehensive surveillance program. It is not intended to diagnose infection nor to guide or monitor treatment. Performed at [facility], [address] [place], [place] [address]    DG Lumbar Spine 2-3 Views  Result Date: 03/28/2020 CLINICAL DATA:  Lumbar decompression of L4-5. EXAM: LUMBAR SPINE - 2-3 VIEW COMPARISON:  September 24, 2018. FINDINGS: No fracture spondylolisthesis is noted. Moderate degenerative disc disease is noted at L3-4 and L4-5. Severe degenerative disc disease is noted at L5-S1. IMPRESSION: Multilevel degenerative disc disease. No acute abnormality seen in the lumbar spine. Electronically  Signed   By: Lupita RaiderJames  Green Jr M.D.   On: 03/28/2020 08:52    Review of Systems  Constitutional: Negative.   HENT: Negative.   Eyes: Negative.   Respiratory: Negative.   Cardiovascular: Negative.   Gastrointestinal: Negative.   Endocrine: Negative.   Genitourinary: Negative.   Musculoskeletal: Positive for arthralgias and back pain.  Neurological: Negative.     There were no vitals taken for this visit. Physical Exam Constitutional:      Appearance: Normal appearance.  HENT:     Head:  Normocephalic.     Right Ear: External ear normal.     Left Ear: External ear normal.     Nose: Nose normal.     Mouth/Throat:     Pharynx: Oropharynx is clear.  Cardiovascular:     Rate and Rhythm: Normal rate and regular rhythm.     Pulses: Normal pulses.     Heart sounds: Normal heart sounds.  Pulmonary:     Effort: Pulmonary effort is normal.     Breath sounds: Normal breath sounds.  Abdominal:     General: Abdomen is flat.     Palpations: Abdomen is soft.  Musculoskeletal:     Cervical back: Normal range of motion.     Comments: Patient is a 69 year old female.  Gait and Station: Appearance: ambulating with no assistive devices and antalgic gait.  Constitutional: General Appearance: healthy-appearing and distress (mild).  Psychiatric: Mood and Affect: active and alert.  Cardiovascular System: Edema Right: none; Dorsalis and posterior tibial pulses 2+. Edema Left: none.  Abdomen: Inspection and Palpation: non-distended and no tenderness.  Skin: Inspection and palpation: no rash.  Lumbar Spine: Inspection: normal alignment. Bony Palpation of the Lumbar Spine: tender at lumbosacral junction.. Bony Palpation of the Right Hip: no tenderness of the greater trochanter and tenderness of the SI joint; Pelvis stable. Bony Palpation of the Left Hip: no tenderness of the greater trochanter and tenderness of the SI joint. Soft Tissue Palpation on the Right: No flank pain with percussion.  Active Range of Motion: limited flexion and extention.  Motor Strength: L1 Motor Strength on the Right: hip flexion iliopsoas 5/5. L1 Motor Strength on the Left: hip flexion iliopsoas 5/5. L2-L4 Motor Strength on the Right: knee extension quadriceps 5/5. L2-L4 Motor Strength on the Left: knee extension quadriceps 5/5. L5 Motor Strength on the Right: ankle dorsiflexion tibialis anterior 5/5 and great toe extension extensor hallucis longus 4/5. L5 Motor Strength on the Left: ankle dorsiflexion tibialis anterior 5/5 and great toe extension extensor hallucis longus 5/5. S1 Motor Strength on the Right: plantar flexion gastrocnemius 5/5. S1 Motor Strength on the Left: plantar flexion gastrocnemius 5/5.  Neurological System: Knee Reflex Right: normal (2). Knee Reflex Left: normal (2). Ankle Reflex Right: normal (2). Ankle Reflex Left: normal (2). Babinski Reflex Right: plantar reflex absent. Babinski Reflex Left: plantar reflex absent. Sensation on the Right: normal distal extremities. Sensation on the Left: normal distal extremities. Special Tests on the Right: no clonus of the ankle/knee and seated straight leg raising test positive. Special Tests on the Left: no clonus of the ankle/knee.  Skin:    General: Skin is warm and dry.  Neurological:     Mental Status: She is alert.   MRI demonstrates disc herniation at L4-5 and lateral recess stenosis impinging upon the L5 nerve roots.  Assessment/Plan  Impression:  Patient with persistent L5 radicular pain predominantly right with neurotension signs EHL weakness due to a disc herniation and spinal stenosis at L4-5  Plan:  To the persistent of her symptoms failed conservative treatment she would like to proceed with a lumbar decompression which has had in the past.  I had an extensive discussion with the patient concerning the pathology relevant anatomy and treatment options. At this point exhausting conservative treatment and in the presence of a  neurologic deficit we discussed microlumbar decompression. I discussed the risks and benefits including bleeding, infection, DVT, PE, anesthetic complications, worsening in their symptoms, improvement in their symptoms, C SF leakage, epidural fibrosis, need for future  surgeries such as revision discectomy and lumbar fusion. I also indicated that this is an operation to basically decompress the nerve root to allow recovery as opposed to fixing a herniated disc and that the incidence of recurrent chest disc herniation can approach 15%. Also that nerve root recovery is variable and may not recover completely.  I discussed the operative course including overnight in the hospital. Immediate ambulation. Follow-up in 2 weeks for suture removal. 6 weeks until healing of the herniation followed by 6 weeks of reconditioning and strengthening of the core musculature. Also discussed the need to employ the concepts of disc pressure management and core motion following the surgery to minimize the risk of recurrent disc herniation. We will obtain preoperative clearance i if necessary and proceed accordingly.  patient can take Percocet but has to take Zofran with that. She can also take the gabapentin at 100 mg.  she is currently taking that medication at that dose there is some slight somnolence.  Plan microlumbar decompression L4-5  Dorothy Spark, PA-C for Dr. Shelle Iron 03/28/2020, 12:11 PM

## 2020-03-28 NOTE — Anesthesia Preprocedure Evaluation (Addendum)
Anesthesia Evaluation  Patient identified by MRN, date of birth, ID band Patient awake    Reviewed: Allergy & Precautions, NPO status , Patient's Chart, lab work & pertinent test results  Airway Mallampati: II  TM Distance: >3 FB Neck ROM: Full    Dental  (+) Edentulous Upper, Dental Advisory Given   Pulmonary    breath sounds clear to auscultation       Cardiovascular hypertension,  Rhythm:Regular Rate:Normal     Neuro/Psych    GI/Hepatic   Endo/Other    Renal/GU      Musculoskeletal   Abdominal   Peds  Hematology   Anesthesia Other Findings   Reproductive/Obstetrics                            Anesthesia Physical Anesthesia Plan  ASA: III  Anesthesia Plan: General   Post-op Pain Management:    Induction: Intravenous  PONV Risk Score and Plan: Ondansetron and Dexamethasone  Airway Management Planned: Oral ETT  Additional Equipment:   Intra-op Plan:   Post-operative Plan: Extubation in OR  Informed Consent: I have reviewed the patients History and Physical, chart, labs and discussed the procedure including the risks, benefits and alternatives for the proposed anesthesia with the patient or authorized representative who has indicated his/her understanding and acceptance.     Dental advisory given  Plan Discussed with: CRNA and Anesthesiologist  Anesthesia Plan Comments: (PAT note written 03/28/2020 by Shonna Chock, PA-C. )       Anesthesia Quick Evaluation

## 2020-03-28 NOTE — Progress Notes (Signed)
Anesthesia Chart Review:  Case: [id number] Date/Time: 03/30/20 0715   Procedure: Microlumbar decompression L4-5 (N/A ) - 2 hrs   Anesthesia type: General   Pre-op diagnosis: Stenosis, herniated disc L4-5   Location: [facility] OR ROOM 76 / [facility]   Surgeons: Susa Day, MD      DISCUSSION: Patient is a 69 year old female scheduled for the above procedure.  History includes never smoker, post-operative N/V, HTN, hypothyroidism, murmur, tachycardia, asthma, glaucoma, CKD (stage I), anemia, polymyalgia rheumatica, anxiety, hiatal hernia GERD, TKA (left 2019, right 2011), back surgeries (L5-S1 microdiskectomy 04/19/05; L3-4 laminotomies/foraminotomies 09/24/18).  BMI is consistent with obesity.  Preoperative evaluation on 02/28/20 with Earnie Larsson, PA-C who wrote (see [place]): "Pre-operative clearance From a cardiopulmonary standpoint it appears that patient is clear for surgery. It does seem that She needs to increase triamterene-HCTZ use to daily use for tighter BP control. BP is elevated today, but she did not take her triamterene this morning and she is experiencing back pain. Recent CMP and EKG reviewed and are ok. CBC is pending. No physical exam findings indicating a need for a chest x-ray. Surgical clearance form will be faxed back to [phone], once CBC is reviewed."  - Follow-up notes in Care Everywhere indicate CBC showed HGB 10.9, so Bayard Beaver M, [place] touched base with hematologist Dr. Harlow Asa who felt labs stable for surgery. She signed medical clearance note and encouraged patient to continue B12 and iron supplements. (HGB 11.5 on 03/27/20.)  Last cardiology telemedicine visit on 01/26/20 by Daivd Council, [place], FNP-C (see [place]) for palpitations and mild TR/MR follow-up. No symptoms to suggest worsening valvular disease. Recent Zio patch showed "only 6 short runs of SVT and no other dysrhythmias". She is being managed with metoprolol and can uptitrate from 25 to  37.5 mg a day if needed. 8-9 month follow-up recommended. (She has follow-up 07/18/20 with Dr. Otho Perl.). Non-ischemic stress echo in 2019.  Last aspirin 03/23/2020.  03/27/2020 presurgical COVID-19 test negative.  Anesthesia team to evaluate on the day of surgery.   VS: BP (!) 163/79   Pulse 65   Temp 36.8 C (Oral)   Resp 20   Ht _0  (1.626 m)   Wt 83.4 kg   SpO2 100%   BMI 31.55 kg/m    PROVIDERS: Earnie Larsson, PA-C is PCP ([place]) - Abran Richard, MD is cardiology ([place]) - Jacqulyn Ducking, MD is HEM-ONC. ([place]). 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 No indication for bone marrow biopsy at that time. One year follow-up planned.   LABS: Labs reviewed: Acceptable for surgery. (all labs ordered are listed, but only abnormal results are displayed)  Labs Reviewed  BASIC METABOLIC PANEL - Abnormal; Notable for the following components:      Result Value   Sodium 134 (*)    Chloride 97 (*)    Creatinine, Ser 1.13 (*)    GFR calc non Af Amer 49 (*)    GFR calc Af Amer 57 (*)    All other components within normal limits  CBC - Abnormal; Notable for the following components:   Hemoglobin 11.5 (*)    All other components within normal limits  SURGICAL PCR SCREEN     IMAGES: Xray L-spine 2-3V 03/27/20: FINDINGS: No fracture spondylolisthesis is noted. Moderate degenerative disc disease is noted at L3-4 and  L4-5. Severe degenerative disc disease is noted at L5-S1. IMPRESSION: Multilevel degenerative disc disease. No acute abnormality seen in the lumbar spine.   EKG: 11/30/2019 [facility]): Sinus rhythm Normal ECG When compared with ECG of 04/20/2018 10:21, minor change in QRS axis.  ST elevation now present in inferior leads.  T wave inversion no longer evident in inferior  leads. Confirmed by my Archie Endo, MD   CV: Echo 12/03/19 [facility]): SUMMARY  Left ventricularsystolic function is normal.  LV ejection fraction = 60-65%.  No segmental wall motion abnormalities seen in the left ventricle  Left ventricular filling pattern is normal.  The right ventricular systolic function is normal.  There is mild mitral regurgitation.  There is mild tricuspid regurgitation.  Compared to the last study dated 03/13/2018, there is no significant  change.     14 day Zioptach 12/03/19: Per 01/26/20 cardiology note [facility]): "The patient did report sensations of "heart racing" that woke her from her sleep. As a result a Zio patch was obtained which revealed only 6 short runs of SVT and no other dysrhythmias."    Stress echo 04/28/18 [facility]): Summary  Normal Stress Echocardiogram with no ischemia suggested on echo images    Past Medical History:  Diagnosis Date  . Anemia   . Anxiety    Hx of panic attacks-none in recent years- (09/17/2018)  . Asthma    seasonal  . CKD (chronic kidney disease), stage I   . DDD (degenerative disc disease), lumbar   . 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 Dysrhythmia    tachycardia  . GERD (gastroesophageal reflux disease)   . Glaucoma   . Heart murmur   . History of hiatal hernia   . Hypertension   . Hypothyroidism   . Lumbar disc herniation   . Migraines   . OA (osteoarthritis)   . Polymyalgia rheumatica ([place])   . PONV (postoperative nausea and vomiting)   . Shingles 2018   current 05/20/18 right leg no ulcerations    Past Surgical History:  Procedure Laterality Date  . APPENDECTOMY    . BACK SURGERY     lumbar - [id number]  . CHOLECYSTECTOMY    . COLONOSCOPY    . JOINT REPLACEMENT Bilateral    knees  . LUMBAR LAMINECTOMY/DECOMPRESSION MICRODISCECTOMY N/A 09/24/2018   Procedure: LUMBAR DECOMPRESSION LUMBAR THREE-FOUR;  Surgeon: Susa Day, MD;  Location: [place];  Service: Orthopedics;   Laterality: N/A;  posterior  . milk duct removal Right 1970s  . THYROIDECTOMY, PARTIAL    . TONSILLECTOMY    . TOTAL ABDOMINAL HYSTERECTOMY    . TOTAL KNEE ARTHROPLASTY Right   . TOTAL KNEE ARTHROPLASTY Left 05/20/2018   Procedure: LEFT TOTAL KNEE ARTHROPLASTY;  Surgeon: Susa Day, MD;  Location: [facility];  Service: Orthopedics;  Laterality: Left;  120 mins    MEDICATIONS: . aspirin EC 81 MG tablet  . fluticasone (FLONASE) 50 MCG/ACT nasal spray  . folic acid (FOLVITE) 301 MCG tablet  . gabapentin (NEURONTIN) 100 MG capsule  . ipratropium (ATROVENT) 0.06 % nasal spray  . levothyroxine (SYNTHROID, LEVOTHROID) 75 MCG tablet  . metoprolol succinate (TOPROL-XL) 25 MG 24 hr tablet  . ondansetron (ZOFRAN) 4 MG tablet  . oxyCODONE-acetaminophen (PERCOCET/ROXICET) 5-325 MG tablet  . polyethylene glycol (MIRALAX / GLYCOLAX) packet  . PREMARIN 0.9 MG tablet  . PROAIR HFA 108 (90 Base) MCG/ACT inhaler  . timolol (TIMOPTIC) 0.5 % ophthalmic solution  .  topiramate (TOPAMAX) 25 MG tablet  . traMADol (ULTRAM) 50 MG tablet  . traZODone (DESYREL) 100 MG tablet  . triamterene-hydrochlorothiazide (MAXZIDE-25) 37.5-25 MG tablet  . vitamin B-12 (CYANOCOBALAMIN) 500 MCG tablet  . vitamin C (ASCORBIC ACID) 500 MG tablet  . vitamin E 200 UNIT capsule  . zinc gluconate 50 MG tablet   No current facility-administered medications for this encounter.     Myra Gianotti, PA-C Surgical Short Stay/Anesthesiology [facility] Phone [phone] [facility] Phone [phone] 03/28/2020 5:50 PM

## 2020-03-28 NOTE — H&P (Signed)
Jocelyn Wiley is an 69 y.o. female.   °Chief Complaint: back and right leg pain °HPI: Reported by patient. °Reason for Visit: low back °Context: The patient is years out from the onset of symptoms °Location (Lower Extremity): lower back pain ; leg pain on the right, , °Severity: pain level 3.5/10 °Quality: sharp °Aggravating Factors: standing for long periods of time; walking for °Associated Symptoms: numbness/tingling; weakness (RLE) °Medications: The patient is taking Mobic. She takes Gabapentin as needed. °minimal help from her injection his radiate down into her big toe. ° °I reviewed Dr. Ramos as noted was technically successful. ° °Past Medical History:  °Diagnosis Date  °• Anemia   °• Anxiety   ° Hx of panic attacks-none in recent years- (09/17/2018)  °• Asthma   ° seasonal  °• CKD (chronic kidney disease), stage I   °• DDD (degenerative disc disease), lumbar   °• DJD (degenerative joint disease) of knee   ° Right  °• Dysrhythmia   ° tachycardia  °• GERD (gastroesophageal reflux disease)   °• Glaucoma   °• Heart murmur   °• History of hiatal hernia   °• Hypertension   °• Hypothyroidism   °• Lumbar disc herniation   °• Migraines   °• OA (osteoarthritis)   °• Polymyalgia rheumatica (HCC)   °• PONV (postoperative nausea and vomiting)   °• Shingles 2018  ° current 05/20/18 right leg no ulcerations  ° ° °Past Surgical History:  °Procedure Laterality Date  °• APPENDECTOMY    °• BACK SURGERY    ° lumbar - 1997ish  °• CHOLECYSTECTOMY    °• COLONOSCOPY    °• JOINT REPLACEMENT Bilateral   ° knees  °• LUMBAR LAMINECTOMY/DECOMPRESSION MICRODISCECTOMY N/A 09/24/2018  ° Procedure: LUMBAR DECOMPRESSION LUMBAR THREE-FOUR;  Surgeon: Beane, Jeffrey, MD;  Location: [facility] OR;  Service: Orthopedics;  Laterality: N/A;  posterior  °• milk duct removal Right 1970s  °• THYROIDECTOMY, PARTIAL    °• TONSILLECTOMY    °• TOTAL ABDOMINAL HYSTERECTOMY    °• TOTAL KNEE ARTHROPLASTY Right   °• TOTAL KNEE ARTHROPLASTY Left 05/20/2018  ° Procedure:  LEFT TOTAL KNEE ARTHROPLASTY;  Surgeon: Beane, Jeffrey, MD;  Location: [facility];  Service: Orthopedics;  Laterality: Left;  120 mins  ° ° °No family history on file. °Social History:  reports that she has never smoked. She has never used smokeless tobacco. She reports previous alcohol use. She reports that she does not use drugs. ° °Allergies:  °Allergies  °Allergen Reactions  °• Almond Oil Anaphylaxis  °• Carvedilol Other (See Comments)  °  VISUAL DISTURBANCE  °• Darvon [Propoxyphene]   °  Felt high   °• Hydrochlorothiazide Other (See Comments)  °  cramps  °• Hydrocodone Nausea And Vomiting  °• Nsaids   °  Upset stomach  °• Amlodipine Other (See Comments)  °  Upset stomach  °• Caffeine Palpitations  °• Cetirizine Other (See Comments)  °  Symptoms of RLS  °• Cyclobenzaprine Other (See Comments)  °  HYPERSOMNOLENCE  °• Lisinopril Cough  °• Losartan Other (See Comments)  °  Muscle cramps  °• Prednisone Palpitations and Other (See Comments)  °• Tizanidine Other (See Comments)  °  Unknown reaction   ° ° °Medications: °ascorbic acid (vitamin C) 500 mg tablet °aspirin 81 mg tablet,delayed release °cyanocobalamin (vit B-12) 500 mcg tablet °EC-Naproxen 500 mg tablet,delayed release °fluticasone propionate 50 mcg/actuation nasal spray,suspension °gabapentin °levothyroxine 75 mcg tablet °loratadine 10 mg tablet °meloxicam  °metoprolol succinate ER 25   mg tablet,extended release 24 hr °ondansetron HCL 4 mg tablet °oxyCODONE-acetaminophen 5 mg-325 mg tablet °Premarin 0.9 mg tablet °ProAir HFA 90 mcg/actuation aerosol inhaler °timoloL maleate 0.5 % eye drops °traMADoL 50 mg tablet °traZODone 100 mg tablet °triamterene 37.5 mg-hydrochlorothiazide 25 mg tablet ° °Results for orders placed or performed during the hospital encounter of 03/27/20 (from the past 48 hour(s))  °Basic metabolic panel     Status: Abnormal  ° Collection Time: 03/27/20  1:40 PM  °Result Value Ref Range  ° Sodium 134 (L) 135 - 145 mmol/L  ° Potassium 3.9 3.5 -  5.1 mmol/L  ° Chloride 97 (L) 98 - 111 mmol/L  ° CO2 28 22 - 32 mmol/L  ° Glucose, Bld 89 70 - 99 mg/dL  °  Comment: Glucose reference range applies only to samples taken after fasting for at least 8 hours.  ° BUN 14 8 - 23 mg/dL  ° Creatinine, Ser 1.13 (H) 0.44 - 1.00 mg/dL  ° Calcium 9.3 8.9 - 10.3 mg/dL  ° GFR calc non Af Amer 49 (L) >60 mL/min  ° GFR calc Af Amer 57 (L) >60 mL/min  ° Anion gap 9 5 - 15  °  Comment: Performed at [facility], [address] [place], [place] [address]  °CBC     Status: Abnormal  ° Collection Time: 03/27/20  1:40 PM  °Result Value Ref Range  ° WBC 6.8 4.0 - 10.5 K/uL  ° RBC 4.14 3.87 - 5.11 MIL/uL  ° Hemoglobin 11.5 (L) 12.0 - 15.0 g/dL  ° HCT 36.2 36 - 46 %  ° MCV 87.4 80.0 - 100.0 fL  ° MCH 27.8 26.0 - 34.0 pg  ° MCHC 31.8 30.0 - 36.0 g/dL  ° RDW 13.2 11.5 - 15.5 %  ° Platelets 269 150 - 400 K/uL  ° nRBC 0.0 0.0 - 0.2 %  °  Comment: Performed at [facility], [address] [place], [place] [address]  °Surgical pcr screen     Status: None  ° Collection Time: 03/27/20  1:45 PM  ° Specimen: Nasal Mucosa; Nasal Swab  °Result Value Ref Range  ° MRSA, PCR NEGATIVE NEGATIVE  ° Staphylococcus aureus NEGATIVE NEGATIVE  °  Comment: (NOTE) °The Xpert SA Assay (FDA approved for NASAL specimens in patients 22 °years of age and older), is one component of a comprehensive °surveillance program. It is not intended to diagnose infection nor to °guide or monitor treatment. °Performed at [facility], [address] [place],  °[address] °  ° °DG Lumbar Spine 2-3 Views ° °Result Date: 03/28/2020 °CLINICAL DATA:  Lumbar decompression of L4-5. EXAM: LUMBAR SPINE - 2-3 VIEW COMPARISON:  September 24, 2018. FINDINGS: No fracture spondylolisthesis is noted. Moderate degenerative disc disease is noted at L3-4 and L4-5. Severe degenerative disc disease is noted at L5-S1. IMPRESSION: Multilevel degenerative disc disease. No acute abnormality seen in the lumbar spine. Electronically  Signed   By: James  Green Jr M.D.   On: 03/28/2020 08:52  ° ° °Review of Systems  °Constitutional: Negative.   °HENT: Negative.   °Eyes: Negative.   °Respiratory: Negative.   °Cardiovascular: Negative.   °Gastrointestinal: Negative.   °Endocrine: Negative.   °Genitourinary: Negative.   °Musculoskeletal: Positive for arthralgias and back pain.  °Neurological: Negative.   ° ° °There were no vitals taken for this visit. °Physical Exam °Constitutional:   °   Appearance: Normal appearance.  °HENT:  °   Head:   Normocephalic.  °   Right Ear: External ear normal.  °   Left Ear: External ear normal.  °   Nose: Nose normal.  °   Mouth/Throat:  °   Pharynx: Oropharynx is clear.  °Cardiovascular:  °   Rate and Rhythm: Normal rate and regular rhythm.  °   Pulses: Normal pulses.  °   Heart sounds: Normal heart sounds.  °Pulmonary:  °   Effort: Pulmonary effort is normal.  °   Breath sounds: Normal breath sounds.  °Abdominal:  °   General: Abdomen is flat.  °   Palpations: Abdomen is soft.  °Musculoskeletal:  °   Cervical back: Normal range of motion.  °   Comments: Patient is a 69-year-old female. ° °Gait and Station: Appearance: ambulating with no assistive devices and antalgic gait. ° °Constitutional: General Appearance: healthy-appearing and distress (mild). ° °Psychiatric: Mood and Affect: active and alert. ° °Cardiovascular System: Edema Right: none; Dorsalis and posterior tibial pulses 2+. Edema Left: none. ° °Abdomen: Inspection and Palpation: non-distended and no tenderness. ° °Skin: Inspection and palpation: no rash. ° °Lumbar Spine: Inspection: normal alignment. Bony Palpation of the Lumbar Spine: tender at lumbosacral junction.. Bony Palpation of the Right Hip: no tenderness of the greater trochanter and tenderness of the SI joint; Pelvis stable. Bony Palpation of the Left Hip: no tenderness of the greater trochanter and tenderness of the SI joint. Soft Tissue Palpation on the Right: No flank pain with percussion.  Active Range of Motion: limited flexion and extention. ° °Motor Strength: L1 Motor Strength on the Right: hip flexion iliopsoas 5/5. L1 Motor Strength on the Left: hip flexion iliopsoas 5/5. L2-L4 Motor Strength on the Right: knee extension quadriceps 5/5. L2-L4 Motor Strength on the Left: knee extension quadriceps 5/5. L5 Motor Strength on the Right: ankle dorsiflexion tibialis anterior 5/5 and great toe extension extensor hallucis longus 4/5. L5 Motor Strength on the Left: ankle dorsiflexion tibialis anterior 5/5 and great toe extension extensor hallucis longus 5/5. S1 Motor Strength on the Right: plantar flexion gastrocnemius 5/5. S1 Motor Strength on the Left: plantar flexion gastrocnemius 5/5. ° °Neurological System: Knee Reflex Right: normal (2). Knee Reflex Left: normal (2). Ankle Reflex Right: normal (2). Ankle Reflex Left: normal (2). Babinski Reflex Right: plantar reflex absent. Babinski Reflex Left: plantar reflex absent. Sensation on the Right: normal distal extremities. Sensation on the Left: normal distal extremities. Special Tests on the Right: no clonus of the ankle/knee and seated straight leg raising test positive. Special Tests on the Left: no clonus of the ankle/knee.  °Skin: °   General: Skin is warm and dry.  °Neurological:  °   Mental Status: She is alert.  ° °MRI demonstrates disc herniation at L4-5 and lateral recess stenosis impinging upon the L5 nerve roots. ° °Assessment/Plan ° °Impression: ° °Patient with persistent L5 radicular pain predominantly right with neurotension signs EHL weakness due to a disc herniation and spinal stenosis at L4-5 ° °Plan: ° °To the persistent of her symptoms failed conservative treatment she would like to proceed with a lumbar decompression which has had in the past. ° °I had an extensive discussion with the patient concerning the pathology relevant anatomy and treatment options. At this point exhausting conservative treatment and in the presence of a  neurologic deficit we discussed microlumbar decompression. I discussed the risks and benefits including bleeding, infection, DVT, PE, anesthetic complications, worsening in their symptoms, improvement in their symptoms, C SF leakage, epidural fibrosis, need for future   surgeries such as revision discectomy and lumbar fusion. I also indicated that this is an operation to basically decompress the nerve root to allow recovery as opposed to fixing a herniated disc and that the incidence of recurrent chest disc herniation can approach 15%. Also that nerve root recovery is variable and may not recover completely. ° °I discussed the operative course including overnight in the hospital. Immediate ambulation. Follow-up in 2 weeks for suture removal. 6 weeks until healing of the herniation followed by 6 weeks of reconditioning and strengthening of the core musculature. Also discussed the need to employ the concepts of disc pressure management and core motion following the surgery to minimize the risk of recurrent disc herniation. We will obtain preoperative clearance i if necessary and proceed accordingly. ° °patient can take Percocet but has to take Zofran with that. She can also take the gabapentin at 100 mg. ° °she is currently taking that medication at that dose there is some slight somnolence. ° °Plan microlumbar decompression L4-5 ° °Sally-Ann Cutbirth M Janice Seales, PA-C for Dr. Beane °03/28/2020, 12:11 PM ° ° °

## 2020-03-30 ENCOUNTER — Ambulatory Visit (HOSPITAL_COMMUNITY)
Admission: RE | Admit: 2020-03-30 | Discharge: 2020-03-31 | Disposition: A | Discharge status: Home / Self Care | Payer: Medicare Other | Attending: Specialist | Admitting: Specialist

## 2020-03-30 ENCOUNTER — Ambulatory Visit (HOSPITAL_COMMUNITY): Payer: Medicare Other | Admitting: Vascular Surgery

## 2020-03-30 ENCOUNTER — Ambulatory Visit (HOSPITAL_COMMUNITY): Payer: Medicare Other | Admitting: Anesthesiology

## 2020-03-30 ENCOUNTER — Encounter (HOSPITAL_COMMUNITY): Payer: Self-pay | Admitting: Specialist

## 2020-03-30 ENCOUNTER — Other Ambulatory Visit: Payer: Self-pay

## 2020-03-30 ENCOUNTER — Ambulatory Visit (HOSPITAL_COMMUNITY): Payer: Medicare Other

## 2020-03-30 ENCOUNTER — Encounter (HOSPITAL_COMMUNITY)
Admission: RE | Disposition: A | Discharge status: Home / Self Care | Payer: Self-pay | Source: Home / Self Care | Attending: Specialist

## 2020-03-30 DIAGNOSIS — N181 Chronic kidney disease, stage 1: Secondary | ICD-10-CM | POA: Insufficient documentation

## 2020-03-30 DIAGNOSIS — K219 Gastro-esophageal reflux disease without esophagitis: Secondary | ICD-10-CM | POA: Diagnosis not present

## 2020-03-30 DIAGNOSIS — Z79899 Other long term (current) drug therapy: Secondary | ICD-10-CM | POA: Insufficient documentation

## 2020-03-30 DIAGNOSIS — I129 Hypertensive chronic kidney disease with stage 1 through stage 4 chronic kidney disease, or unspecified chronic kidney disease: Secondary | ICD-10-CM | POA: Insufficient documentation

## 2020-03-30 DIAGNOSIS — M48061 Spinal stenosis, lumbar region without neurogenic claudication: Secondary | ICD-10-CM | POA: Diagnosis present

## 2020-03-30 DIAGNOSIS — J45909 Unspecified asthma, uncomplicated: Secondary | ICD-10-CM | POA: Diagnosis not present

## 2020-03-30 DIAGNOSIS — Z7982 Long term (current) use of aspirin: Secondary | ICD-10-CM | POA: Diagnosis not present

## 2020-03-30 DIAGNOSIS — M353 Polymyalgia rheumatica: Secondary | ICD-10-CM | POA: Diagnosis not present

## 2020-03-30 DIAGNOSIS — E039 Hypothyroidism, unspecified: Secondary | ICD-10-CM | POA: Diagnosis not present

## 2020-03-30 DIAGNOSIS — F419 Anxiety disorder, unspecified: Secondary | ICD-10-CM | POA: Insufficient documentation

## 2020-03-30 DIAGNOSIS — M5126 Other intervertebral disc displacement, lumbar region: Secondary | ICD-10-CM | POA: Diagnosis not present

## 2020-03-30 DIAGNOSIS — Z79890 Hormone replacement therapy (postmenopausal): Secondary | ICD-10-CM | POA: Insufficient documentation

## 2020-03-30 DIAGNOSIS — Z419 Encounter for procedure for purposes other than remedying health state, unspecified: Secondary | ICD-10-CM

## 2020-03-30 HISTORY — PX: LUMBAR LAMINECTOMY/DECOMPRESSION MICRODISCECTOMY: SHX5026

## 2020-03-30 SURGERY — LUMBAR LAMINECTOMY/DECOMPRESSION MICRODISCECTOMY 1 LEVEL
Anesthesia: General | Site: Spine Lumbar

## 2020-03-30 MED ORDER — METOPROLOL SUCCINATE ER 25 MG PO TB24
25.0000 mg | ORAL_TABLET | Freq: Every day | ORAL | Status: DC
  Administered 2020-03-31: 25 mg via ORAL
  Filled 2020-03-30: qty 1

## 2020-03-30 MED ORDER — TRAZODONE HCL 50 MG PO TABS
50.0000 mg | ORAL_TABLET | Freq: Every evening | ORAL | Status: DC | PRN
  Filled 2020-03-30: qty 2

## 2020-03-30 MED ORDER — ORAL CARE MOUTH RINSE: 15.0000 mL | Freq: Once | OROMUCOSAL | Status: AC

## 2020-03-30 MED ORDER — BUPIVACAINE-EPINEPHRINE 0.5% -1:200000 IJ SOLN
INTRAMUSCULAR | Status: DC | PRN
  Administered 2020-03-30: 5 mL

## 2020-03-30 MED ORDER — PROPOFOL 10 MG/ML IV BOLUS
INTRAVENOUS | Status: DC | PRN
  Administered 2020-03-30: 150 mg via INTRAVENOUS

## 2020-03-30 MED ORDER — FLUTICASONE PROPIONATE 50 MCG/ACT NA SUSP: 1.0000 | Freq: Every day | NASAL | Status: DC | PRN

## 2020-03-30 MED ORDER — HYDROCODONE-ACETAMINOPHEN 7.5-325 MG PO TABS
1.0000 | ORAL_TABLET | ORAL | Status: DC | PRN
  Administered 2020-03-30 – 2020-03-31 (×5): 1 via ORAL
  Filled 2020-03-30 (×5): qty 1

## 2020-03-30 MED ORDER — BISACODYL 5 MG PO TBEC: 5.0000 mg | DELAYED_RELEASE_TABLET | Freq: Every day | ORAL | Status: DC | PRN

## 2020-03-30 MED ORDER — KCL IN DEXTROSE-NACL 20-5-0.45 MEQ/L-%-% IV SOLN: INTRAVENOUS | Status: DC

## 2020-03-30 MED ORDER — CHLORHEXIDINE GLUCONATE 0.12 % MT SOLN: 15.0000 mL | Freq: Once | OROMUCOSAL | Status: AC

## 2020-03-30 MED ORDER — LACTATED RINGERS IV SOLN: INTRAVENOUS | Status: DC

## 2020-03-30 MED ORDER — ALUM & MAG HYDROXIDE-SIMETH 200-200-20 MG/5ML PO SUSP: 30.0000 mL | Freq: Four times a day (QID) | ORAL | Status: DC | PRN

## 2020-03-30 MED ORDER — POLYETHYLENE GLYCOL 3350 17 G PO PACK: 17.0000 g | PACK | Freq: Every day | ORAL | Status: DC | PRN

## 2020-03-30 MED ORDER — BUPIVACAINE-EPINEPHRINE 0.5% -1:200000 IJ SOLN
INTRAMUSCULAR | Status: AC
  Filled 2020-03-30: qty 1

## 2020-03-30 MED ORDER — THROMBIN 20000 UNITS EX SOLR
CUTANEOUS | Status: AC
  Filled 2020-03-30: qty 20000

## 2020-03-30 MED ORDER — HYDROMORPHONE HCL 1 MG/ML IJ SOLN: 0.5000 mg | INTRAMUSCULAR | Status: DC | PRN

## 2020-03-30 MED ORDER — SUGAMMADEX SODIUM 200 MG/2ML IV SOLN
INTRAVENOUS | Status: DC | PRN
  Administered 2020-03-30: 200 mg via INTRAVENOUS

## 2020-03-30 MED ORDER — CHLORHEXIDINE GLUCONATE 0.12 % MT SOLN
OROMUCOSAL | Status: AC
  Administered 2020-03-30: 15 mL via OROMUCOSAL
  Filled 2020-03-30: qty 15

## 2020-03-30 MED ORDER — LIDOCAINE 2% (20 MG/ML) 5 ML SYRINGE
INTRAMUSCULAR | Status: DC | PRN
  Administered 2020-03-30: 50 mg via INTRAVENOUS

## 2020-03-30 MED ORDER — MENTHOL 3 MG MT LOZG: 1.0000 | LOZENGE | OROMUCOSAL | Status: DC | PRN

## 2020-03-30 MED ORDER — ALBUTEROL SULFATE HFA 108 (90 BASE) MCG/ACT IN AERS: 2.0000 | INHALATION_SPRAY | RESPIRATORY_TRACT | Status: DC | PRN

## 2020-03-30 MED ORDER — DEXAMETHASONE SODIUM PHOSPHATE 10 MG/ML IJ SOLN
INTRAMUSCULAR | Status: DC | PRN
  Administered 2020-03-30: 10 mg via INTRAVENOUS

## 2020-03-30 MED ORDER — FOLIC ACID 1 MG PO TABS
1.0000 mg | ORAL_TABLET | Freq: Every day | ORAL | Status: DC
  Administered 2020-03-30 – 2020-03-31 (×2): 1 mg via ORAL
  Filled 2020-03-30 (×2): qty 1

## 2020-03-30 MED ORDER — ASCORBIC ACID 500 MG PO TABS
1000.0000 mg | ORAL_TABLET | Freq: Every day | ORAL | Status: DC
  Administered 2020-03-31: 1000 mg via ORAL
  Filled 2020-03-30: qty 2

## 2020-03-30 MED ORDER — SODIUM CHLORIDE 0.9 % IV SOLN
INTRAVENOUS | Status: DC | PRN
  Administered 2020-03-30: 500 mL

## 2020-03-30 MED ORDER — IPRATROPIUM BROMIDE 0.06 % NA SOLN: 2.0000 | Freq: Four times a day (QID) | NASAL | Status: DC | PRN

## 2020-03-30 MED ORDER — THROMBIN 20000 UNITS EX SOLR
CUTANEOUS | Status: DC | PRN
  Administered 2020-03-30: 20 mL via TOPICAL

## 2020-03-30 MED ORDER — ESTROGENS CONJUGATED 0.45 MG PO TABS
0.9000 mg | ORAL_TABLET | Freq: Every day | ORAL | Status: DC
  Filled 2020-03-30: qty 1

## 2020-03-30 MED ORDER — MIDAZOLAM HCL 2 MG/2ML IJ SOLN
INTRAMUSCULAR | Status: AC
  Filled 2020-03-30: qty 2

## 2020-03-30 MED ORDER — ROCURONIUM BROMIDE 10 MG/ML (PF) SYRINGE
PREFILLED_SYRINGE | INTRAVENOUS | Status: DC | PRN
  Administered 2020-03-30: 50 mg via INTRAVENOUS
  Administered 2020-03-30: 20 mg via INTRAVENOUS

## 2020-03-30 MED ORDER — 0.9 % SODIUM CHLORIDE (POUR BTL) OPTIME
TOPICAL | Status: DC | PRN
  Administered 2020-03-30: 1000 mL

## 2020-03-30 MED ORDER — PHENYLEPHRINE HCL-NACL 10-0.9 MG/250ML-% IV SOLN
INTRAVENOUS | Status: DC | PRN
  Administered 2020-03-30: 30 ug/min via INTRAVENOUS

## 2020-03-30 MED ORDER — ACETAMINOPHEN 650 MG RE SUPP: 650.0000 mg | RECTAL | Status: DC | PRN

## 2020-03-30 MED ORDER — ONDANSETRON HCL 4 MG/2ML IJ SOLN
4.0000 mg | Freq: Four times a day (QID) | INTRAMUSCULAR | Status: DC | PRN
  Administered 2020-03-30: 4 mg via INTRAVENOUS
  Filled 2020-03-30: qty 2

## 2020-03-30 MED ORDER — ONDANSETRON HCL 4 MG PO TABS: 4.0000 mg | ORAL_TABLET | Freq: Four times a day (QID) | ORAL | Status: DC | PRN

## 2020-03-30 MED ORDER — ONDANSETRON HCL 4 MG PO TABS: 4.0000 mg | ORAL_TABLET | Freq: Three times a day (TID) | ORAL | Status: DC | PRN

## 2020-03-30 MED ORDER — LEVOTHYROXINE SODIUM 75 MCG PO TABS
75.0000 ug | ORAL_TABLET | Freq: Every day | ORAL | Status: DC
  Administered 2020-03-31: 75 ug via ORAL
  Filled 2020-03-30: qty 1

## 2020-03-30 MED ORDER — CEFAZOLIN SODIUM-DEXTROSE 2-4 GM/100ML-% IV SOLN
2.0000 g | INTRAVENOUS | Status: AC
  Administered 2020-03-30: 2 g via INTRAVENOUS
  Filled 2020-03-30: qty 100

## 2020-03-30 MED ORDER — PROPOFOL 10 MG/ML IV BOLUS
INTRAVENOUS | Status: AC
  Filled 2020-03-30: qty 20

## 2020-03-30 MED ORDER — TRIAMTERENE-HCTZ 37.5-25 MG PO TABS
1.0000 | ORAL_TABLET | Freq: Every day | ORAL | Status: DC
  Administered 2020-03-30 – 2020-03-31 (×2): 1 via ORAL
  Filled 2020-03-30 (×2): qty 1

## 2020-03-30 MED ORDER — FENTANYL CITRATE (PF) 250 MCG/5ML IJ SOLN
INTRAMUSCULAR | Status: AC
  Filled 2020-03-30: qty 5

## 2020-03-30 MED ORDER — GABAPENTIN 100 MG PO CAPS: 100.0000 mg | ORAL_CAPSULE | Freq: Three times a day (TID) | ORAL | Status: DC | PRN

## 2020-03-30 MED ORDER — FENTANYL CITRATE (PF) 100 MCG/2ML IJ SOLN
INTRAMUSCULAR | Status: DC | PRN
  Administered 2020-03-30 (×2): 50 ug via INTRAVENOUS
  Administered 2020-03-30: 100 ug via INTRAVENOUS

## 2020-03-30 MED ORDER — ACETAMINOPHEN 500 MG PO TABS
1000.0000 mg | ORAL_TABLET | Freq: Four times a day (QID) | ORAL | Status: DC
  Administered 2020-03-30 (×2): 1000 mg via ORAL
  Filled 2020-03-30 (×3): qty 2

## 2020-03-30 MED ORDER — DOCUSATE SODIUM 100 MG PO CAPS
100.0000 mg | ORAL_CAPSULE | Freq: Two times a day (BID) | ORAL | Status: DC
  Administered 2020-03-30 – 2020-03-31 (×2): 100 mg via ORAL
  Filled 2020-03-30 (×2): qty 1

## 2020-03-30 MED ORDER — TIMOLOL MALEATE 0.5 % OP SOLN
1.0000 [drp] | Freq: Two times a day (BID) | OPHTHALMIC | Status: DC
  Administered 2020-03-30 – 2020-03-31 (×2): 1 [drp] via OPHTHALMIC
  Filled 2020-03-30: qty 5

## 2020-03-30 MED ORDER — ONDANSETRON HCL 4 MG/2ML IJ SOLN
INTRAMUSCULAR | Status: DC | PRN
  Administered 2020-03-30: 4 mg via INTRAVENOUS

## 2020-03-30 MED ORDER — TRIAMTERENE-HCTZ 37.5-25 MG PO TABS: 1.0000 | ORAL_TABLET | ORAL | Status: DC

## 2020-03-30 MED ORDER — MAGNESIUM CITRATE PO SOLN: 1.0000 | Freq: Once | ORAL | Status: DC | PRN

## 2020-03-30 MED ORDER — METHOCARBAMOL 1000 MG/10ML IJ SOLN
500.0000 mg | Freq: Four times a day (QID) | INTRAVENOUS | Status: DC | PRN
  Filled 2020-03-30: qty 5

## 2020-03-30 MED ORDER — TRAMADOL HCL 50 MG PO TABS: 50.0000 mg | ORAL_TABLET | Freq: Four times a day (QID) | ORAL | Status: DC | PRN

## 2020-03-30 MED ORDER — CYANOCOBALAMIN 500 MCG PO TABS
500.0000 ug | ORAL_TABLET | Freq: Every day | ORAL | Status: DC
  Administered 2020-03-31: 500 ug via ORAL
  Filled 2020-03-30: qty 1

## 2020-03-30 MED ORDER — CEFAZOLIN SODIUM-DEXTROSE 2-4 GM/100ML-% IV SOLN
2.0000 g | Freq: Three times a day (TID) | INTRAVENOUS | Status: AC
  Administered 2020-03-30 (×2): 2 g via INTRAVENOUS
  Filled 2020-03-30 (×2): qty 100

## 2020-03-30 MED ORDER — FENTANYL CITRATE (PF) 100 MCG/2ML IJ SOLN
INTRAMUSCULAR | Status: AC
  Administered 2020-03-30: 50 ug via INTRAVENOUS
  Filled 2020-03-30: qty 2

## 2020-03-30 MED ORDER — METHOCARBAMOL 500 MG PO TABS
500.0000 mg | ORAL_TABLET | Freq: Four times a day (QID) | ORAL | Status: DC | PRN
  Administered 2020-03-30: 500 mg via ORAL
  Filled 2020-03-30: qty 1

## 2020-03-30 MED ORDER — PHENOL 1.4 % MT LIQD: 1.0000 | OROMUCOSAL | Status: DC | PRN

## 2020-03-30 MED ORDER — ACETAMINOPHEN 325 MG PO TABS: 650.0000 mg | ORAL_TABLET | ORAL | Status: DC | PRN

## 2020-03-30 MED ORDER — TOPIRAMATE 25 MG PO TABS: 25.0000 mg | ORAL_TABLET | Freq: Every day | ORAL | Status: DC | PRN

## 2020-03-30 MED ORDER — FENTANYL CITRATE (PF) 100 MCG/2ML IJ SOLN
25.0000 ug | INTRAMUSCULAR | Status: DC | PRN
  Administered 2020-03-30: 50 ug via INTRAVENOUS

## 2020-03-30 MED ORDER — MIDAZOLAM HCL 5 MG/5ML IJ SOLN
INTRAMUSCULAR | Status: DC | PRN
  Administered 2020-03-30: 2 mg via INTRAVENOUS

## 2020-03-30 MED ORDER — ONDANSETRON HCL 4 MG/2ML IJ SOLN: 4.0000 mg | Freq: Once | INTRAMUSCULAR | Status: DC | PRN

## 2020-03-30 SURGICAL SUPPLY — 58 items
BAG DECANTER FOR FLEXI CONT (MISCELLANEOUS) ×3 IMPLANT
BAND INSRT 18 STRL LF DISP RB (MISCELLANEOUS) ×3
BAND RUBBER #18 3X1/16 STRL (MISCELLANEOUS) ×8 IMPLANT
CLEANER TIP ELECTROSURG 2X2 (MISCELLANEOUS) ×3 IMPLANT
CLOSURE WOUND 1/2 X4 (GAUZE/BANDAGES/DRESSINGS) ×1
CNTNR URN SCR LID CUP LEK RST (MISCELLANEOUS) ×1 IMPLANT
CONT SPEC 4OZ STRL OR WHT (MISCELLANEOUS) ×3
COVER WAND RF STERILE (DRAPES) ×3 IMPLANT
DRAPE LAPAROTOMY 100X72X124 (DRAPES) ×3 IMPLANT
DRAPE MICROSCOPE LEICA (MISCELLANEOUS) ×3 IMPLANT
DRAPE SHEET LG 3/4 BI-LAMINATE (DRAPES) ×3 IMPLANT
DRAPE SURG 17X11 SM STRL (DRAPES) ×3 IMPLANT
DRAPE UTILITY XL STRL (DRAPES) ×3 IMPLANT
DRESSING AQUACEL AG SP 3.5X6 (GAUZE/BANDAGES/DRESSINGS) IMPLANT
DRSG AQUACEL AG ADV 3.5X 4 (GAUZE/BANDAGES/DRESSINGS) IMPLANT
DRSG AQUACEL AG ADV 3.5X 6 (GAUZE/BANDAGES/DRESSINGS) ×2 IMPLANT
DRSG AQUACEL AG SP 3.5X6 (GAUZE/BANDAGES/DRESSINGS) ×3
DRSG TELFA 3X8 NADH (GAUZE/BANDAGES/DRESSINGS) IMPLANT
DURAPREP 26ML APPLICATOR (WOUND CARE) ×3 IMPLANT
DURASEAL SPINE SEALANT 3ML (MISCELLANEOUS) IMPLANT
ELECT BLADE 4.0 EZ CLEAN MEGAD (MISCELLANEOUS) ×3
ELECT REM PT RETURN 9FT ADLT (ELECTROSURGICAL) ×3
ELECTRODE BLDE 4.0 EZ CLN MEGD (MISCELLANEOUS) IMPLANT
ELECTRODE REM PT RTRN 9FT ADLT (ELECTROSURGICAL) ×1 IMPLANT
GLOVE BIO SURGEON STRL SZ7 (GLOVE) ×3 IMPLANT
GLOVE BIOGEL PI IND STRL 7.0 (GLOVE) ×1 IMPLANT
GLOVE BIOGEL PI INDICATOR 7.0 (GLOVE) ×6
GLOVE SURG SS PI 7.5 STRL IVOR (GLOVE) ×9 IMPLANT
GLOVE SURG SS PI 8.0 STRL IVOR (GLOVE) ×6 IMPLANT
GOWN STRL REUS W/ TWL LRG LVL3 (GOWN DISPOSABLE) ×1 IMPLANT
GOWN STRL REUS W/ TWL XL LVL3 (GOWN DISPOSABLE) ×3 IMPLANT
GOWN STRL REUS W/TWL LRG LVL3 (GOWN DISPOSABLE) ×3
GOWN STRL REUS W/TWL XL LVL3 (GOWN DISPOSABLE) ×9
IV CATH 14GX2 1/4 (CATHETERS) ×3 IMPLANT
KIT BASIN OR (CUSTOM PROCEDURE TRAY) ×3 IMPLANT
KIT POSITION SURG JACKSON T1 (MISCELLANEOUS) ×3 IMPLANT
NEEDLE 22X1 1/2 (OR ONLY) (NEEDLE) ×3 IMPLANT
NEEDLE SPNL 18GX3.5 QUINCKE PK (NEEDLE) ×6 IMPLANT
PACK LAMINECTOMY NEURO (CUSTOM PROCEDURE TRAY) ×3 IMPLANT
PATTIES SURGICAL .75X.75 (GAUZE/BANDAGES/DRESSINGS) ×3 IMPLANT
SPONGE LAP 4X18 RFD (DISPOSABLE) IMPLANT
SPONGE SURGIFOAM ABS GEL 100 (HEMOSTASIS) ×3 IMPLANT
STAPLER SKIN PROX WIDE 3.9 (STAPLE) ×3 IMPLANT
STAPLER VISISTAT (STAPLE) ×4 IMPLANT
STRIP CLOSURE SKIN 1/2X4 (GAUZE/BANDAGES/DRESSINGS) ×2 IMPLANT
SUT NURALON 4 0 TR CR/8 (SUTURE) IMPLANT
SUT PROLENE 3 0 PS 2 (SUTURE) IMPLANT
SUT VIC AB 1 CT1 27 (SUTURE) ×3
SUT VIC AB 1 CT1 27XBRD ANTBC (SUTURE) IMPLANT
SUT VIC AB 1-0 CT2 27 (SUTURE) ×3 IMPLANT
SUT VIC AB 2-0 CT1 27 (SUTURE) ×3
SUT VIC AB 2-0 CT1 TAPERPNT 27 (SUTURE) IMPLANT
SUT VIC AB 2-0 CT2 27 (SUTURE) ×3 IMPLANT
SYR 3ML LL SCALE MARK (SYRINGE) ×3 IMPLANT
TOWEL GREEN STERILE (TOWEL DISPOSABLE) ×3 IMPLANT
TOWEL GREEN STERILE FF (TOWEL DISPOSABLE) ×3 IMPLANT
TRAY FOLEY MTR SLVR 16FR STAT (SET/KITS/TRAYS/PACK) ×3 IMPLANT
YANKAUER SUCT BULB TIP NO VENT (SUCTIONS) ×3 IMPLANT

## 2020-03-30 NOTE — Anesthesia Postprocedure Evaluation (Signed)
Anesthesia Post Note  Patient: Jocelyn Wiley  Procedure(s) Performed: Microlumbar decompression Lumbar four-five (N/A Spine Lumbar)     Patient location during evaluation: PACU Anesthesia Type: General Level of consciousness: awake and alert Pain management: pain level controlled Vital Signs Assessment: post-procedure vital signs reviewed and stable Respiratory status: spontaneous breathing, nonlabored ventilation, respiratory function stable and patient connected to nasal cannula oxygen Cardiovascular status: blood pressure returned to baseline and stable Postop Assessment: no apparent nausea or vomiting Anesthetic complications: no   No complications documented.  Last Vitals:  Vitals:   03/30/20 1105 03/30/20 1544  BP: (!) 200/73 (!) 172/74  Pulse: (!) 58 (!) 59  Resp: 19 16  Temp: 37.1 C 36.4 C  SpO2: 100% 100%    Last Pain:  Vitals:   03/30/20 1544  TempSrc: Oral  PainSc:                  Mohsin Crum COKER

## 2020-03-30 NOTE — Anesthesia Procedure Notes (Signed)
Procedure Name: Intubation Date/Time: 03/30/2020 7:43 AM Performed by: Elliot Dally, CRNA Pre-anesthesia Checklist: Patient identified, Emergency Drugs available, Suction available and Patient being monitored Patient Re-evaluated:Patient Re-evaluated prior to induction Oxygen Delivery Method: Circle System Utilized Preoxygenation: Pre-oxygenation with 100% oxygen Induction Type: IV induction Ventilation: Mask ventilation without difficulty Laryngoscope Size: Miller and 2 Grade View: Grade I Tube type: Oral Tube size: 7.0 mm Number of attempts: 1 Airway Equipment and Method: Stylet and Oral airway Placement Confirmation: ETT inserted through vocal cords under direct vision,  positive ETCO2 and breath sounds checked- equal and bilateral Secured at: 20 cm Tube secured with: Tape Dental Injury: Teeth and Oropharynx as per pre-operative assessment

## 2020-03-30 NOTE — Evaluation (Signed)
Occupational Therapy Evaluation Patient Details Name: Jocelyn Wiley MRN: [id number] DOB: 1951-08-26 Today's Date: 03/30/2020    History of Present Illness Pt is 69 yo female admitted for spinal stenosis and s/p microlumbar decompression L4-L5 on 03/30/20.  Pt with PMH including anxiety, DJD, DDD, GERD, HTN, OA, back surgery , and bil TKA.   Clinical Impression   PTA pt living with spouse, reports being independent for BADL/IADL. Pt has hx of back sxs and familiarity with precautions and recovery. Back handout provided and reviewed adls in detail. Pt educated on: set an alarm at night for medication, avoid sitting for long periods of time, correct bed positioning for sleeping, correct sequence for bed mobility, avoiding lifting more than 5 pounds and never wash directly over incision. Pt also educated on use of reacher and sock aide for LB dressing, which she plans to purchase. Encouraged use of shower chair which pt has at home. All education is complete and patient indicates understanding. No further OT needs are indicated at this time, OT will sign off. Thank you for this consult.       Follow Up Recommendations  No OT follow up    Equipment Recommendations  None recommended by OT    Recommendations for Other Services       Precautions / Restrictions Precautions Precautions: Back Precaution Booklet Issued: Yes (comment) Precaution Comments: reviewed throughout session Restrictions Weight Bearing Restrictions: No      Mobility Bed Mobility Overal bed mobility: Needs Assistance Bed Mobility: Rolling;Sidelying to Sit;Sit to Sidelying Rolling: Min guard Sidelying to sit: Min guard     Sit to sidelying: Min assist General bed mobility comments: Cues for log roll technique; min A for legs back to bed  Transfers Overall transfer level: Needs assistance Equipment used: None Transfers: Sit to/from Stand Sit to Stand: Min guard         General transfer comment: close  guard for safety    Balance Overall balance assessment: Needs assistance Sitting-balance support: No upper extremity supported;Feet supported Sitting balance-Leahy Scale: Good     Standing balance support: No upper extremity supported;During functional activity Standing balance-Leahy Scale: Good Standing balance comment: performed toileting ADLs with supervision                           ADL either performed or assessed with clinical judgement   ADL                                         General ADL Comments: Pt demonstrated ability to complete BADL at mod I level. Reviewed safe back precautions with BADL/IADL routine. Pt able to complete figure four method for LBD and trialed donning socks in supine position. Also reviewed use of sock aide and reacher, which pt plans to purchase for home.  She has needed DME in home.     Vision Baseline Vision/History: Wears glasses Wears Glasses: At all times Patient Visual Report: No change from baseline       Perception     Praxis      Pertinent Vitals/Pain Pain Assessment: Faces Faces Pain Scale: Hurts a little bit Pain Location: back-with movement Pain Descriptors / Indicators: Grimacing Pain Intervention(s): Monitored during session     Hand Dominance     Extremity/Trunk Assessment Upper Extremity Assessment Upper Extremity Assessment: Overall WFL for tasks assessed  Lower Extremity Assessment Lower Extremity Assessment: Defer to PT evaluation   Cervical / Trunk Assessment Cervical / Trunk Assessment: Normal   Communication Communication Communication: No difficulties   Cognition Arousal/Alertness: Awake/alert Behavior During Therapy: WFL for tasks assessed/performed Overall Cognitive Status: Within Functional Limits for tasks assessed                                     General Comments  Educated on back precautions    Exercises     Shoulder Instructions      Home  Living Family/patient expects to be discharged to:: Private residence Living Arrangements: Spouse/significant other;Children Available Help at Discharge: Family;Available 24 hours/day Type of Home: House Home Access: Stairs to enter [employer] of Steps: 4 Entrance Stairs-Rails: Right;Left;Can reach both Home Layout: One level     Bathroom Shower/Tub: Walk-in shower;Tub/shower unit   Bathroom Toilet: Handicapped height     Home Equipment: Environmental consultant - 2 wheels;Walker - 4 wheels;Walker - standard;Cane - single point;Bedside commode;Shower seat;Grab bars - tub/shower;Hand held shower head;Wheelchair - IT trainer;Other (comment)   Additional Comments: lift chair      Prior Functioning/Environment Level of Independence: Independent        Comments: Reports independent with ADLs, IADLs, and community ambulation.        OT Problem List: Decreased knowledge of use of DME or AE;Decreased knowledge of precautions      OT Treatment/Interventions:      OT Goals(Current goals can be found in the care plan section) Acute Rehab OT Goals Patient Stated Goal: return home OT Goal Formulation: All assessment and education complete, DC therapy  OT Frequency:     Barriers to D/C:            Co-evaluation              AM-PAC OT "6 Clicks" Daily Activity     Outcome Measure Help from another person eating meals?: None Help from another person taking care of personal grooming?: None Help from another person toileting, which includes using toliet, bedpan, or urinal?: None Help from another person bathing (including washing, rinsing, drying)?: None Help from another person to put on and taking off regular upper body clothing?: None Help from another person to put on and taking off regular lower body clothing?: None 6 Click Score: 24   End of Session Nurse Communication: Mobility status  Activity Tolerance: Patient tolerated treatment well Patient left: in  bed;with call bell/phone within reach  OT Visit Diagnosis: Other abnormalities of gait and mobility (R26.89);Unsteadiness on feet (R26.81)                Time: 2694-8546 OT Time Calculation (min): 11 min Charges:  OT General Charges $OT Visit: 1 Visit OT Evaluation $OT Eval Low Complexity: 1 Low  Dalphine Handing, MSOT, OTR/L Acute Rehabilitation Services [facility] Office Number: [phone] Pager: [phone]  Dalphine Handing 03/30/2020, 4:48 PM

## 2020-03-30 NOTE — Brief Op Note (Signed)
03/30/2020  9:31 AM  PATIENT:  Jocelyn Wiley  69 y.o. female  PRE-OPERATIVE DIAGNOSIS:  Stenosis, herniated disc L4-5  POST-OPERATIVE DIAGNOSIS:  Stenosis, herniated disc Lumbar four-five  PROCEDURE:  Procedure(s): Microlumbar decompression Lumbar four-five (N/A)  SURGEON:  Surgeon(s) and Role:    Jene Every, MD - Primary  PHYSICIAN ASSISTANT:   ASSISTANTS: Bissell   ANESTHESIA:   spinal  EBL:  50 mL   BLOOD ADMINISTERED:none  DRAINS: none   LOCAL MEDICATIONS USED:  MARCAINE     SPECIMEN:  No Specimen  DISPOSITION OF SPECIMEN:  N/A  COUNTS:  YES  TOURNIQUET:  * No tourniquets in log *  DICTATION: .Other Dictation: Dictation Number [id number]  PLAN OF CARE: Admit for overnight observation  PATIENT DISPOSITION:  PACU - hemodynamically stable.   Delay start of Pharmacological VTE agent (>24hrs) due to surgical blood loss or risk of bleeding: yes

## 2020-03-30 NOTE — Interval H&P Note (Signed)
History and Physical Interval Note:  03/30/2020 7:06 AM  Jocelyn Wiley  has presented today for surgery, with the diagnosis of Stenosis, herniated disc L4-5.  The various methods of treatment have been discussed with the patient and family. After consideration of risks, benefits and other options for treatment, the patient has consented to  Procedure(s) with comments: Microlumbar decompression L4-5 (N/A) - 2 hrs as a surgical intervention.  The patient's history has been reviewed, patient examined, no change in status, stable for surgery.  I have reviewed the patient's chart and labs.  Questions were answered to the patient's satisfaction.     Javier Docker

## 2020-03-30 NOTE — Discharge Instructions (Signed)

## 2020-03-30 NOTE — Evaluation (Signed)
Physical Therapy Evaluation Patient Details Name: Jocelyn Wiley MRN: [id number] DOB: 1950/09/22 Today's Date: 03/30/2020   History of Present Illness  Pt is 69 yo female admitted for spinal stenosis and s/p microlumbar decompression L4-L5 on 03/30/20.  Pt with PMH including anxiety, DJD, DDD, GERD, HTN, OA, back surgery , and bil TKA.  Clinical Impression  Pt admitted with above diagnosis. Pt needing cues for back precautions with transfers.  She was able to ambulate 100' x2 with and without RW.   Pt with good progression and rehab potential.  Pt currently with functional limitations due to the deficits listed below (see PT Problem List). Pt will benefit from skilled PT to increase their independence and safety with mobility to allow discharge to the venue listed below.       Follow Up Recommendations Follow surgeon's recommendation for DC plan and follow-up therapies;Supervision/Assistance - 24 hour    Equipment Recommendations  None recommended by PT    Recommendations for Other Services       Precautions / Restrictions Precautions Precautions: Back Precaution Booklet Issued: Yes (comment)      Mobility  Bed Mobility Overal bed mobility: Needs Assistance Bed Mobility: Rolling;Sidelying to Sit;Sit to Sidelying Rolling: Min guard Sidelying to sit: Min guard     Sit to sidelying: Min assist General bed mobility comments: Cues for log roll technique; min A for legs back to bed  Transfers Overall transfer level: Needs assistance Equipment used: Rolling walker (2 wheeled) Transfers: Sit to/from Stand Sit to Stand: Min guard         General transfer comment: sit to stand x 3:  Demonstrated safe standing with cues for hand placement but with return to sitting pt needing cues to use hands and descend slowly.  Pt tending to lean posteriorly with sitting needing cues to correct.  Ambulation/Gait Ambulation/Gait assistance: Min guard Gait Distance (Feet): 200 Feet Assistive  device: Rolling walker (2 wheeled);None Gait Pattern/deviations: Step-through pattern;Decreased stride length Gait velocity: decreased   General Gait Details: ambulated 100' with RW and 100' without RW; cues for RW use and posture  Stairs            Wheelchair Mobility    Modified Rankin (Stroke Patients Only)       Balance Overall balance assessment: Needs assistance Sitting-balance support: No upper extremity supported;Feet supported Sitting balance-Leahy Scale: Good     Standing balance support: No upper extremity supported;During functional activity Standing balance-Leahy Scale: Good Standing balance comment: performed toileting ADLs with supervision                             Pertinent Vitals/Pain Pain Assessment: Faces Faces Pain Scale: Hurts a little bit Pain Location: back-with movement Pain Descriptors / Indicators: Grimacing    Home Living Family/patient expects to be discharged to:: Private residence Living Arrangements: Spouse/significant other;Children Available Help at Discharge: Family;Available 24 hours/day Type of Home: House Home Access: Stairs to enter Entrance Stairs-Rails: Right;Left;Can reach both Entrance Stairs-Number of Steps: 4 Home Layout: One level Home Equipment: Walker - 2 wheels;Walker - 4 wheels;Walker - standard;Cane - single point;Bedside commode;Shower seat;Grab bars - tub/shower;Hand held shower head;Wheelchair - IT trainer;Other (comment) Additional Comments: lift chair    Prior Function Level of Independence: Independent         Comments: Reports independent with ADLs, IADLs, and community ambulation.     Hand Dominance        Extremity/Trunk Assessment  Upper Extremity Assessment Upper Extremity Assessment: Overall WFL for tasks assessed    Lower Extremity Assessment Lower Extremity Assessment: Overall WFL for tasks assessed (ROM WFL: MMT throughout at least 3/5 but not further  tested due to acuity of back sx)    Cervical / Trunk Assessment Cervical / Trunk Assessment: Normal  Communication   Communication: No difficulties  Cognition Arousal/Alertness: Awake/alert Behavior During Therapy: WFL for tasks assessed/performed Overall Cognitive Status: Within Functional Limits for tasks assessed                                        General Comments General comments (skin integrity, edema, etc.): Educated on back precautions    Exercises     Assessment/Plan    PT Assessment Patient needs continued PT services  PT Problem List Decreased strength;Decreased mobility;Decreased safety awareness;Decreased knowledge of precautions;Decreased activity tolerance;Decreased balance;Decreased knowledge of use of DME       PT Treatment Interventions DME instruction;Therapeutic activities;Gait training;Therapeutic exercise;Patient/family education;Stair training;Balance training;Functional mobility training    PT Goals (Current goals can be found in the Care Plan section)  Acute Rehab PT Goals Patient Stated Goal: return home PT Goal Formulation: With patient/family Time For Goal Achievement: 04/13/20 Potential to Achieve Goals: Good    Frequency Min 5X/week   Barriers to discharge        Co-evaluation               AM-PAC PT "6 Clicks" Mobility  Outcome Measure Help needed turning from your back to your side while in a flat bed without using bedrails?: A Little Help needed moving from lying on your back to sitting on the side of a flat bed without using bedrails?: A Little Help needed moving to and from a bed to a chair (including a wheelchair)?: None Help needed standing up from a chair using your arms (e.g., wheelchair or bedside chair)?: None Help needed to walk in hospital room?: None Help needed climbing 3-5 steps with a railing? : A Little 6 Click Score: 21    End of Session Equipment Utilized During Treatment: Gait  belt Activity Tolerance: Patient tolerated treatment well Patient left: in bed;with call bell/phone within reach;with family/visitor present Nurse Communication: Mobility status PT Visit Diagnosis: Other abnormalities of gait and mobility (R26.89);Muscle weakness (generalized) (M62.81)    Time: 1400-1430 PT Time Calculation (min) (ACUTE ONLY): 30 min   Charges:   PT Evaluation $PT Eval Low Complexity: 1 Low PT Treatments $Therapeutic Activity: 8-22 mins        Jocelyn Wiley, PT Acute Rehab Services Pager [phone] [facility] [phone]    Jocelyn Wiley 03/30/2020, 2:45 PM

## 2020-03-30 NOTE — Op Note (Signed)
NAME: Jocelyn Wiley, [facility] MEDICAL RECORD OE:[id number] ACCOUNT [id number] DATE OF BIRTH:Jun 26, 1951 FACILITY: [facility] LOCATION: [facility] PHYSICIAN:Krystian Ferrentino Connye Burkitt, MD  OPERATIVE REPORT  DATE OF PROCEDURE:  03/30/2020  PREOPERATIVE DIAGNOSIS:  Spinal stenosis, disk protrusion L4-L5.  POSTOPERATIVE DIAGNOSIS:  Spinal stenosis.  PROCEDURE PERFORMED: 1.  Microlumbar decompression at L4-L5 bilaterally with bilateral hemilaminotomy, decompression, and lateral recess. 2.  Foraminotomies L4-L5, right.  ANESTHESIA:  General.  ASSISTANT:  Andrez Grime, PA  HISTORY:  A 69 year old female with refractory right lower extremity radicular pain.  EHL weakness.  Neural tension signs.  MRI indicating lateral recess stenosis due to ligamentum flavum hypertrophy and facet hypertrophy.  Compressing the L5 nerve root.   She is indicated for decompression of the L5 nerve root by a lumbar decompression.  Risks and benefits discussed including bleeding, infection, damage to neurovascular structures, no change in symptoms, worsening symptoms, DVT, PE, anesthetic  complications, etc.  DESCRIPTION OF PROCEDURE:  With the patient in supine position.  After induction of adequate general anesthesia, 2 grams Kefzol.  She was placed prone on the Wilson frame [place] table.  All bony prominences well padded.  Foley to gravity.  Lumbar  regions prepped and draped in the usual sterile fashion.  Two 18-gauge spinal needles were utilized to localize 4-5 interspace.  This was confirmed with x-ray.  She had a previous surgical scar that was excised.  I made an incision from the spinous  process of 4 to below 5.  Subcutaneous tissue was dissected.  With Cardiolite, achieved hemostasis.  Dorsal lumbar fascia divided in line with the skin incision.  Paraspinous muscle elevated from lamina of 4 and 5.  McCullough retractor was placed.   X-rays were taken to confirm the spinous process of 4 and 5.  Next, a Leksell rongeur was utilized  to remove the spinous processes of L4 and 5.  A curette was utilized to skeletonize the lamina of 4 and 5 in the interspace.  We performed hemilaminotomies  of the caudad edge of 4 with 2 and a 3 mm Kerrison bilaterally.  Used a micro curet to detach ligamentum flavum from the cephalad edge of 5 as well.  With neuro patties placed beneath the ligamentum flavum, hemilaminotomies were performed to the point  detaching the ligamentum flavum.  There was hypertrophic ligamentum flavum noted, particularly in the lateral recess on to the right.  Following this, I utilized a D'Errico and gently mobilized the thecal sac medially and protected the L4 nerve root.  I  decompressed the lateral recess to the medial border of the pedicle with a 2 mm Kerrison.  There was facet hypertrophy, a small synovial cyst, and an osteophyte that was compressing the lateral recess.  A foraminotomy of 4 and 5 were then performed as  well.  Bipolar electrocautery was utilized to achieve hemostasis.  The disk was evaluated.  There is no evidence of disk herniation here.  A neuro probe passed freely out the foramen of 4 and 5 following the decompression.  I removed ligamentum flavum  centrally and from the left side as well with hemilaminotomy.  Lateral recess was less involved than on the right.  We copiously irrigated with antibiotic irrigation.  Woodson retractor placed in the foramen of 4 and 5.  Confirmatory radiograph obtained.   This passed freely out the foramen of 4 and 5.  There was 1 cm of excursion of the 5 root medially the pedicle without tension.  No evidence of CSF leakage or active bleeding.  Bone wax was placed on the cancellous surfaces.  I removed the McCullough  retractor, irrigated the paraspinous musculature.  No active bleeding.  I repaired the dorsal lumbar fascia with #1 Vicryl interrupted figure-of-eight sutures, subcu with 2-0, multiple layers and skin with staples.  The wound was dressed sterilely,  placed  supine on the hospital bed, extubated without difficulty and transported to the recovery room in satisfactory condition.  The patient tolerated the procedure well.  No complications.    ESTIMATED BLOOD LOSS:  Blood loss was 25 mL.  CN/NUANCE  D:03/30/2020 T:03/30/2020 JOB:[id number]

## 2020-03-30 NOTE — Transfer of Care (Signed)
Immediate Anesthesia Transfer of Care Note  Patient: Jocelyn Wiley  Procedure(s) Performed: Microlumbar decompression Lumbar four-five (N/A Spine Lumbar)  Patient Location: PACU  Anesthesia Type:General  Level of Consciousness: awake and alert   Airway & Oxygen Therapy: Patient Spontanous Breathing and Patient connected to nasal cannula oxygen  Post-op Assessment: Report given to RN and Post -op Vital signs reviewed and stable  Post vital signs: Reviewed and stable  Last Vitals:  Vitals Value Taken Time  BP    Temp    Pulse    Resp    SpO2      Last Pain:  Vitals:   03/30/20 0940  TempSrc:   PainSc: (P) 4       Patients Stated Pain Goal: (P) 0 (03/30/20 0940)  Complications: No complications documented.

## 2020-03-31 ENCOUNTER — Encounter (HOSPITAL_COMMUNITY): Payer: Self-pay | Admitting: Specialist

## 2020-03-31 DIAGNOSIS — M48061 Spinal stenosis, lumbar region without neurogenic claudication: Secondary | ICD-10-CM | POA: Diagnosis not present

## 2020-03-31 LAB — BASIC METABOLIC PANEL
Anion gap: 8 (ref 5–15)
BUN: 9 mg/dL (ref 8–23)
CO2: 28 mmol/L (ref 22–32)
Calcium: 9 mg/dL (ref 8.9–10.3)
Chloride: 101 mmol/L (ref 98–111)
Creatinine, Ser: 1.09 mg/dL — ABNORMAL HIGH (ref 0.44–1.00)
GFR calc Af Amer: 60 mL/min — ABNORMAL LOW (ref >60)
GFR calc non Af Amer: 51 mL/min — ABNORMAL LOW (ref >60)
Glucose, Bld: 107 mg/dL — ABNORMAL HIGH (ref 70–99)
Potassium: 3.8 mmol/L (ref 3.5–5.1)
Sodium: 137 mmol/L (ref 135–145)

## 2020-03-31 MED ORDER — DOCUSATE SODIUM 100 MG PO CAPS: 100.0000 mg | ORAL_CAPSULE | Freq: Two times a day (BID) | ORAL | 0 refills | Status: AC

## 2020-03-31 MED ORDER — OXYCODONE-ACETAMINOPHEN 5-325 MG PO TABS: 1.0000 | ORAL_TABLET | Freq: Four times a day (QID) | ORAL | 0 refills | Status: AC | PRN

## 2020-03-31 MED ORDER — POLYETHYLENE GLYCOL 3350 17 G PO PACK: 17.0000 g | PACK | Freq: Every day | ORAL | 1 refills | Status: AC | PRN

## 2020-03-31 MED ORDER — ONDANSETRON HCL 4 MG PO TABS: 4.0000 mg | ORAL_TABLET | Freq: Four times a day (QID) | ORAL | 0 refills | Status: AC | PRN

## 2020-03-31 NOTE — Discharge Summary (Signed)
Physician Discharge Summary   Patient ID: ERMINIA MCNEW MRN: [id number] DOB/AGE: 12/13/1950 69 y.o.  Admit date: 03/30/2020 Discharge date: 03/31/2020  Primary Diagnosis:   Stenosis, herniated disc L4-5  Admission Diagnoses:  Past Medical History:  Diagnosis Date   Anemia    Anxiety    Hx of panic attacks-none in recent years- (09/17/2018)   Asthma    seasonal   CKD (chronic kidney disease), stage I    DDD (degenerative disc disease), lumbar    DJD (degenerative joint disease) of knee    Right   Dysrhythmia    tachycardia   GERD (gastroesophageal reflux disease)    Glaucoma    Heart murmur    History of hiatal hernia    Hypertension    Hypothyroidism    Lumbar disc herniation    Migraines    OA (osteoarthritis)    Polymyalgia rheumatica (HCC)    PONV (postoperative nausea and vomiting)    Shingles 2018   current 05/20/18 right leg no ulcerations   Discharge Diagnoses:   Active Problems:   Spinal stenosis at L4-L5 level  Procedure:  Procedure(s) (LRB): Microlumbar decompression Lumbar four-five (N/A)   Consults: None  HPI:  see H&P    Laboratory Data: Hospital Outpatient Visit on 03/27/2020  Component Date Value Ref Range Status   Sodium 03/27/2020 134* 135 - 145 mmol/L Final   Potassium 03/27/2020 3.9  3.5 - 5.1 mmol/L Final   Chloride 03/27/2020 97* 98 - 111 mmol/L Final   CO2 03/27/2020 28  22 - 32 mmol/L Final   Glucose, Bld 03/27/2020 89  70 - 99 mg/dL Final   Glucose reference range applies only to samples taken after fasting for at least 8 hours.   BUN 03/27/2020 14  8 - 23 mg/dL Final   Creatinine, Ser 03/27/2020 1.13* 0.44 - 1.00 mg/dL Final   Calcium 23/30/0762 9.3  8.9 - 10.3 mg/dL Final   GFR calc non Af Amer 03/27/2020 49* >60 mL/min Final   GFR calc Af Amer 03/27/2020 57* >60 mL/min Final   Anion gap 03/27/2020 9  5 - 15 Final   Performed at [facility], [address], [place], [place] [address]    WBC 03/27/2020 6.8  4.0 - 10.5 K/uL Final   RBC 03/27/2020 4.14  3.87 - 5.11 MIL/uL Final   Hemoglobin 03/27/2020 11.5* 12.0 - 15.0 g/dL Final   HCT 54/56/2563 36.2  36 - 46 % Final   MCV 03/27/2020 87.4  80.0 - 100.0 fL Final   MCH 03/27/2020 27.8  26.0 - 34.0 pg Final   MCHC 03/27/2020 31.8  30.0 - 36.0 g/dL Final   RDW 89/37/3428 13.2  11.5 - 15.5 % Final   Platelets 03/27/2020 269  150 - 400 K/uL Final   nRBC 03/27/2020 0.0  0.0 - 0.2 % Final   Performed at [facility], [address] [place], [place] [address]   MRSA, PCR 03/27/2020 NEGATIVE  NEGATIVE Final   Staphylococcus aureus 03/27/2020 NEGATIVE  NEGATIVE Final   Comment: (NOTE) The Xpert SA Assay (FDA approved for NASAL specimens in patients 52 years of age and older), is one component of a comprehensive surveillance program. It is not intended to diagnose infection nor to guide or monitor treatment. Performed at [facility], [address], [place], [place] [address]    No results for input(s): HGB in the last 72 hours. No results for input(s): WBC, RBC, HCT, PLT in the  last 72 hours. Recent Labs    03/31/20 0634  NA 137  K 3.8  CL 101  CO2 28  BUN 9  CREATININE 1.09*  GLUCOSE 107*  CALCIUM 9.0   No results for input(s): LABPT, INR in the last 72 hours.  X-Rays:DG Lumbar Spine 2-3 Views  Result Date: 03/30/2020 CLINICAL DATA:  L4-5 decompression. EXAM: LUMBAR SPINE - 2-3 VIEW COMPARISON:  March 27, 2020.  September 24, 2018. FINDINGS: Three intraoperative cross-table lateral projections were obtained of the lumbar spine. The first image demonstrates surgical probes at the L3-4 and S1-2 levels. The second image demonstrates surgical probe and retractors posterior to L5. The third image demonstrates surgical probes posterior to the L4 and L5 vertebral bodies. IMPRESSION: Surgical localization as described above. Electronically Signed   By: Lupita RaiderJames  Green Jr M.D.   On: 03/30/2020 09:29   DG  Lumbar Spine 2-3 Views  Result Date: 03/28/2020 CLINICAL DATA:  Lumbar decompression of L4-5. EXAM: LUMBAR SPINE - 2-3 VIEW COMPARISON:  September 24, 2018. FINDINGS: No fracture spondylolisthesis is noted. Moderate degenerative disc disease is noted at L3-4 and L4-5. Severe degenerative disc disease is noted at L5-S1. IMPRESSION: Multilevel degenerative disc disease. No acute abnormality seen in the lumbar spine. Electronically Signed   By: Lupita RaiderJames  Green Jr M.D.   On: 03/28/2020 08:52    EKG: Orders placed or performed during the hospital encounter of 05/12/18   EKG 12 lead   EKG 12 lead     Hospital Course: [facility] and taken to the OR and underwent the above state procedure without complications.  Patient tolerated the procedure well and was later transferred to the recovery room and then to the orthopaedic floor for postoperative care.  They were given PO and IV analgesics for pain control following their surgery.  They were given 24 hours of postoperative antibiotics.   PT was consulted postop to assist with mobility and transfers.  The patient was allowed to be WBAT with therapy and was taught back precautions. Discharge planning was consulted to help with postop disposition and equipment needs.  Patient had a good night on the evening of surgery and started to get up OOB with therapy on day one. Patient was seen in rounds and was ready to go home on day one.  They were given discharge instructions and dressing directions.  They were instructed on when to follow up in the office with Dr. Shelle IronBeane.   Diet: Regular diet Activity:WBAT, Lspine precautions Follow-up:in 14 days Disposition - Home Discharged Condition: good   Discharge Instructions    Call MD / Call 911   Complete by: As directed    If you experience chest pain or shortness of breath, CALL 911 and be transported to the hospital emergency room.  If you develope a fever above 101 F, pus (white  drainage) or increased drainage or redness at the wound, or calf pain, call your surgeon's office.   Constipation Prevention   Complete by: As directed    Drink plenty of fluids.  Prune juice may be helpful.  You may use a stool softener, such as Colace (over the counter) 100 mg twice a day.  Use MiraLax (over the counter) for constipation as needed.   Diet - low sodium heart healthy   Complete by: As directed    Increase activity slowly as tolerated   Complete by: As directed      Allergies as of 03/31/2020  Reactions   Almond Oil Anaphylaxis   Carvedilol Other (See Comments)   VISUAL DISTURBANCE   Darvon [propoxyphene]    Felt high    Hydrochlorothiazide Other (See Comments)   cramps   Hydrocodone Nausea And Vomiting   Nsaids    Upset stomach   Amlodipine Other (See Comments)   Upset stomach   Caffeine Palpitations   Cetirizine Other (See Comments)   Symptoms of RLS   Cyclobenzaprine Other (See Comments)   HYPERSOMNOLENCE   Lisinopril Cough   Losartan Other (See Comments)   Muscle cramps   Prednisone Palpitations, Other (See Comments)   Tizanidine Other (See Comments)   Unknown reaction       Medication List    STOP taking these medications   aspirin EC 81 MG tablet   vitamin E 200 UNIT capsule     TAKE these medications   docusate sodium 100 MG capsule Commonly known as: COLACE Take 1 capsule (100 mg total) by mouth 2 (two) times daily.   Flonase 50 MCG/ACT nasal spray Generic drug: fluticasone Place 1 spray into both nostrils daily as needed for allergies or rhinitis.   folic acid 400 MCG tablet Commonly known as: FOLVITE Take 800 mcg by mouth daily.   gabapentin 100 MG capsule Commonly known as: NEURONTIN Take 100 mg by mouth 3 (three) times daily as needed (neuropathy).   ipratropium 0.06 % nasal spray Commonly known as: ATROVENT Place 2 sprays into both nostrils 4 (four) times daily as needed for allergies.   levothyroxine 75 MCG  tablet Commonly known as: SYNTHROID Take 75 mcg by mouth daily before breakfast.   metoprolol succinate 25 MG 24 hr tablet Commonly known as: TOPROL-XL Take 25 mg by mouth daily.   ondansetron 4 MG tablet Commonly known as: ZOFRAN Take 4 mg by mouth every 8 (eight) hours as needed for nausea or vomiting. What changed: Another medication with the same name was added. Make sure you understand how and when to take each.   ondansetron 4 MG tablet Commonly known as: ZOFRAN Take 1 tablet (4 mg total) by mouth every 6 (six) hours as needed for nausea or vomiting. What changed: You were already taking a medication with the same name, and this prescription was added. Make sure you understand how and when to take each.   oxyCODONE-acetaminophen 5-325 MG tablet Commonly known as: PERCOCET/ROXICET Take 1-2 tablets by mouth every 6 (six) hours as needed. What changed:   how much to take  when to take this   polyethylene glycol 17 g packet Commonly known as: MIRALAX / GLYCOLAX Take 17 g by mouth daily as needed for mild constipation.   Premarin 0.9 MG tablet Generic drug: estrogens (conjugated) Take 0.9 mg by mouth daily.   ProAir HFA 108 (90 Base) MCG/ACT inhaler Generic drug: albuterol Inhale 2 puffs into the lungs every 4 (four) hours as needed for wheezing or shortness of breath.   timolol 0.5 % ophthalmic solution Commonly known as: TIMOPTIC Place 1 drop into both eyes 2 (two) times daily.   topiramate 25 MG tablet Commonly known as: TOPAMAX Take 25 mg by mouth daily as needed (for migraines).   traMADol 50 MG tablet Commonly known as: ULTRAM Take 1 tablet (50 mg total) by mouth every 6 (six) hours as needed for moderate pain (pain.).   traZODone 100 MG tablet Commonly known as: DESYREL Take 50-100 mg by mouth at bedtime as needed for sleep.   triamterene-hydrochlorothiazide 37.5-25 MG tablet Commonly known  as: MAXZIDE-25 Take 1 tablet by mouth every other day.    vitamin B-12 500 MCG tablet Commonly known as: CYANOCOBALAMIN Take 500 mcg by mouth daily.   vitamin C 500 MG tablet Commonly known as: ASCORBIC ACID Take 1,000 mg by mouth daily.   zinc gluconate 50 MG tablet Take 50 mg by mouth daily.       Follow-up Information    Jene Every, MD Follow up in 2 week(s).   Specialty: Orthopedic Surgery Contact information: [address] [place] [address] [phone]               Signed: Andrez Grime, PA-C Orthopaedic Surgery 03/31/2020, 7:52 AM

## 2020-03-31 NOTE — Progress Notes (Signed)
Physical Therapy Treatment Patient Details Name: Jocelyn Wiley MRN: [id number] DOB: [birth year] Today's Date: 03/31/2020    History of Present Illness Pt is 69 yo female admitted for spinal stenosis and s/p microlumbar decompression L4-L5 on 03/30/20.  Pt with PMH including anxiety, DJD, DDD, GERD, HTN, OA, back surgery , and bil TKA.    PT Comments    Pt making excellent progress with functional mobility. She tolerated stair training without difficulties this session. Plan is to d/c home today with family support. PT provided pt education re: back precautions and a generalized walking program for pt to initiate upon d/c home.     Follow Up Recommendations  No PT follow up     Equipment Recommendations  None recommended by PT    Recommendations for Other Services       Precautions / Restrictions Precautions Precautions: Back Precaution Comments: reviewed throughout session Restrictions Weight Bearing Restrictions: No    Mobility  Bed Mobility Overal bed mobility: Needs Assistance Bed Mobility: Rolling;Sidelying to Sit Rolling: Supervision Sidelying to sit: Supervision       General bed mobility comments: cueing to utilize log roll technique  Transfers Overall transfer level: Needs assistance Equipment used: None Transfers: Sit to/from Stand Sit to Stand: Supervision         General transfer comment: no instability or assistance needed  Ambulation/Gait Ambulation/Gait assistance: Supervision Gait Distance (Feet): 500 Feet Assistive device: None Gait Pattern/deviations: Step-through pattern;Decreased stride length Gait velocity: decreased   General Gait Details: pt generally steady overall with hallway ambulation, no LOB or need for physical assistance   Stairs Stairs: Yes Stairs assistance: Supervision Stair Management: One rail Left;Step to pattern;Forwards Number of Stairs: 4 General stair comments: supervision and cueing for safety   Wheelchair  Mobility    Modified Rankin (Stroke Patients Only)       Balance Overall balance assessment: Needs assistance Sitting-balance support: No upper extremity supported;Feet supported Sitting balance-Leahy Scale: Good     Standing balance support: No upper extremity supported;During functional activity Standing balance-Leahy Scale: Fair                              Cognition Arousal/Alertness: Awake/alert Behavior During Therapy: WFL for tasks assessed/performed Overall Cognitive Status: Within Functional Limits for tasks assessed                                        Exercises      General Comments        Pertinent Vitals/Pain Pain Assessment: 0-10 Pain Score: 1  Pain Location: lower back Pain Descriptors / Indicators: Sore Pain Intervention(s): Monitored during session;Repositioned    Home Living                      Prior Function            PT Goals (current goals can now be found in the care plan section) Acute Rehab PT Goals PT Goal Formulation: With patient/family Time For Goal Achievement: 04/13/20 Potential to Achieve Goals: Good Progress towards PT goals: Progressing toward goals    Frequency    Min 5X/week      PT Plan Current plan remains appropriate    Co-evaluation              AM-PAC PT "6 Clicks" Mobility  Outcome Measure  Help needed turning from your back to your side while in a flat bed without using bedrails?: None Help needed moving from lying on your back to sitting on the side of a flat bed without using bedrails?: None Help needed moving to and from a bed to a chair (including a wheelchair)?: None Help needed standing up from a chair using your arms (e.g., wheelchair or bedside chair)?: None Help needed to walk in hospital room?: None Help needed climbing 3-5 steps with a railing? : None 6 Click Score: 24    End of Session   Activity Tolerance: Patient tolerated treatment  well Patient left: in chair;with call bell/phone within reach Nurse Communication: Mobility status PT Visit Diagnosis: Other abnormalities of gait and mobility (R26.89);Muscle weakness (generalized) (M62.81)     Time: 5638-7564 PT Time Calculation (min) (ACUTE ONLY): 19 min  Charges:  $Gait Training: 8-22 mins                     Jocelyn Wiley, DPT  Acute Rehabilitation Services Pager [phone] Office [phone]     Jocelyn Wiley 03/31/2020, 9:01 AM

## 2020-03-31 NOTE — Progress Notes (Signed)
Subjective: 1 Day Post-Op Procedure(s) (LRB): Microlumbar decompression Lumbar four-five (N/A) Patient reports pain as mild.  Nerve pain gone. Reports local soreness only. Some nausea last night. Voiding without difficulty. Ready for D/C  Objective: Vital signs in last 24 hours: Temp:  [97.6 F (36.4 C)-98.7 F (37.1 C)] 97.6 F (36.4 C) (07/16 0739) Pulse Rate:  [58-70] 65 (07/16 0739) Resp:  [16-19] 16 (07/16 0739) BP: (165-200)/(65-84) 185/79 (07/16 0739) SpO2:  [93 %-100 %] 100 % (07/16 0739)  Intake/Output from previous day: 07/15 0701 - 07/16 0700 In: 1140 [P.O.:240; I.V.:900] Out: 200 [Urine:150; Blood:50] Intake/Output this shift: No intake/output data recorded.  No results for input(s): HGB in the last 72 hours. No results for input(s): WBC, RBC, HCT, PLT in the last 72 hours. Recent Labs    03/31/20 0634  NA 137  K 3.8  CL 101  CO2 28  BUN 9  CREATININE 1.09*  GLUCOSE 107*  CALCIUM 9.0   No results for input(s): LABPT, INR in the last 72 hours.  Neurologically intact ABD soft Neurovascular intact Sensation intact distally Intact pulses distally Dorsiflexion/Plantar flexion intact Incision: dressing C/D/I and no drainage No cellulitis present Compartment soft no calf pain or sign of DVT   Assessment/Plan: 1 Day Post-Op Procedure(s) (LRB): Microlumbar decompression Lumbar four-five (N/A) Advance diet Up with therapy D/C IV fluids Discussed DC instructions and dressing instructions D/C today Will discuss with Dr Tenna Delaine Doralee Albino 03/31/2020, 7:48 AM

## 2020-03-31 NOTE — Plan of Care (Signed)
Patient alert and oriented, mae's well, voiding adequate amount of urine, swallowing without difficulty, no c/o pain at time of discharge. Patient discharged home with family. Script and discharged instructions given to patient. Patient and family stated understanding of instructions given. Patient has an appointment with Dr. Beane ?

## 2020-07-22 IMAGING — CR DG LUMBAR SPINE 2-3V
2 series · 2 of 2 positions shown · non-contrast
Comparison: September 24, 2018.

CLINICAL DATA: Lumbar decompression of L4-5.

EXAM:
LUMBAR SPINE - 2-3 VIEW

[w lumbar spine ap]
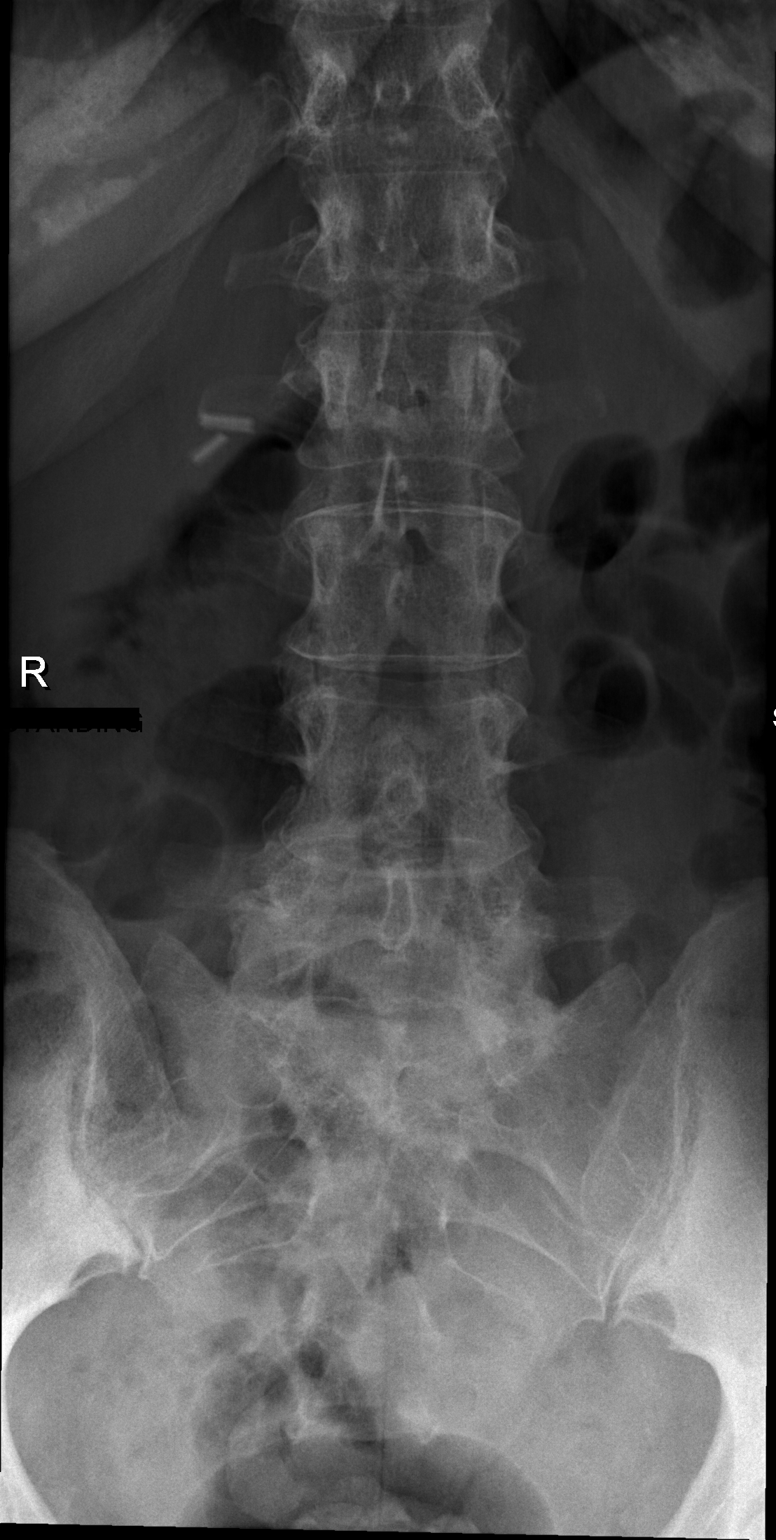

[w lumbar spine lat]
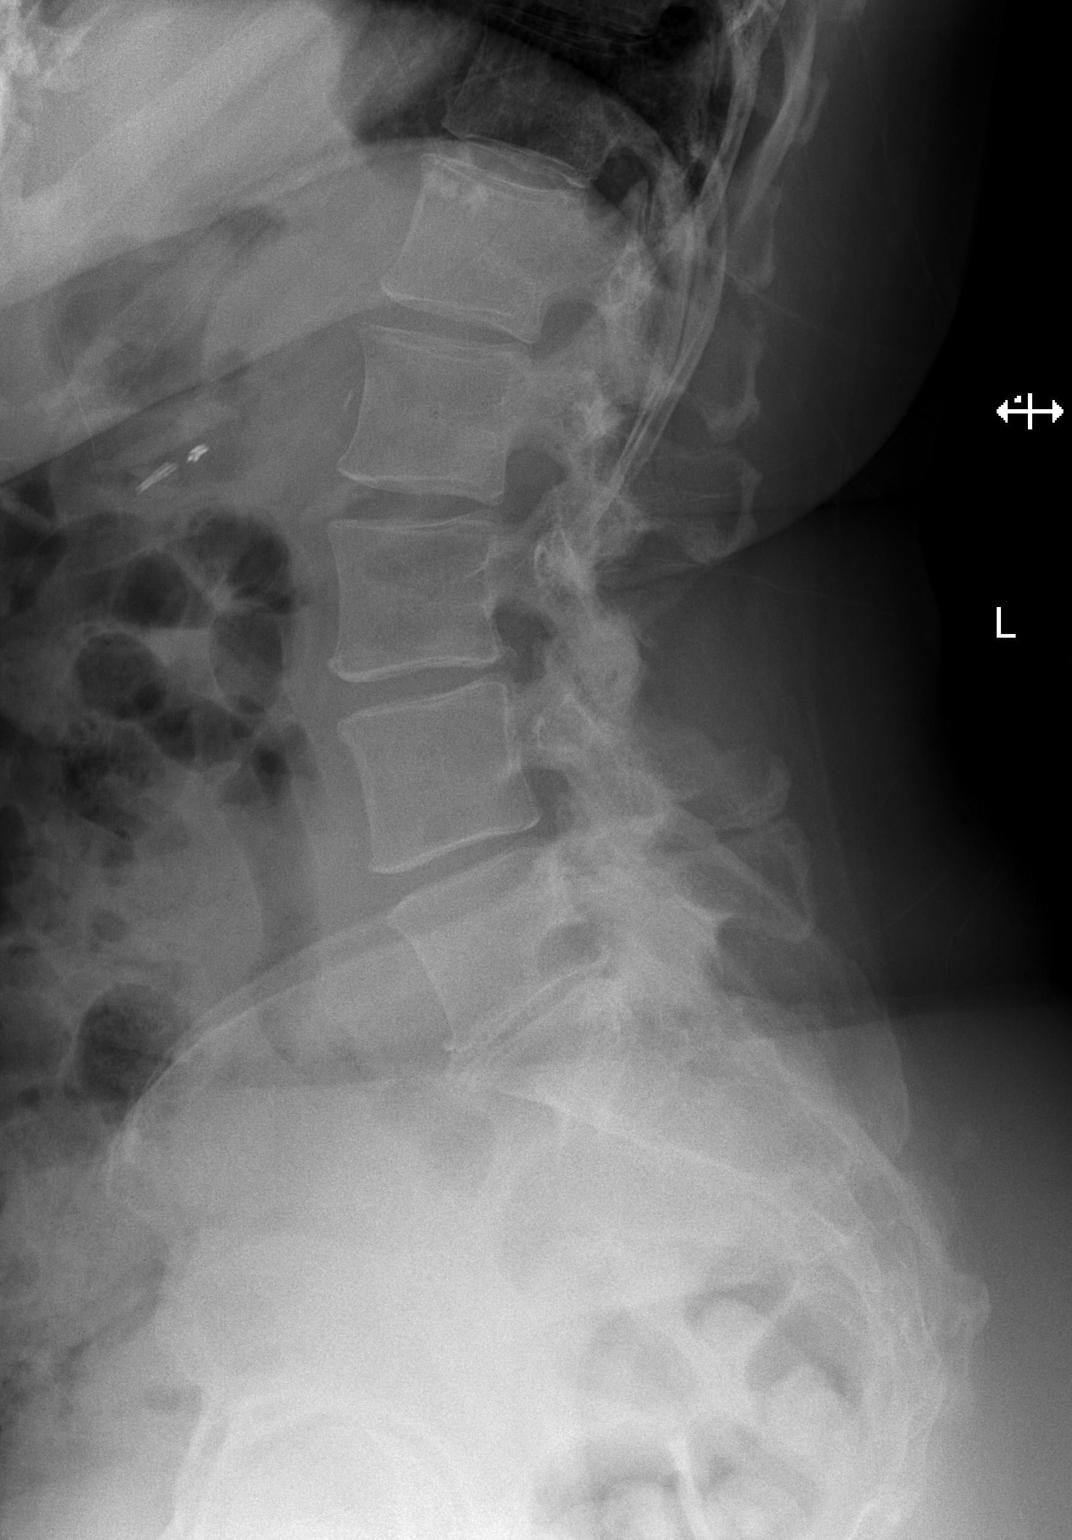

[2 of 2 positions shown; findings below may reference images not displayed]

FINDINGS: No fracture spondylolisthesis is noted. Moderate degenerative disc
disease is noted at L3-4 and L4-5. Severe degenerative disc disease
is noted at L5-S1.
IMPRESSION: Multilevel degenerative disc disease. No acute abnormality seen in
the lumbar spine.

## 2020-07-25 IMAGING — CR DG LUMBAR SPINE 2-3V
3 series · 3 of 3 positions shown · non-contrast
Comparison: [DATE] [DATE], [DATE].  [DATE] [DATE], [DATE].

CLINICAL DATA: L4-5 decompression.

EXAM:
LUMBAR SPINE - 2-3 VIEW

[lateral (1 of 3)]
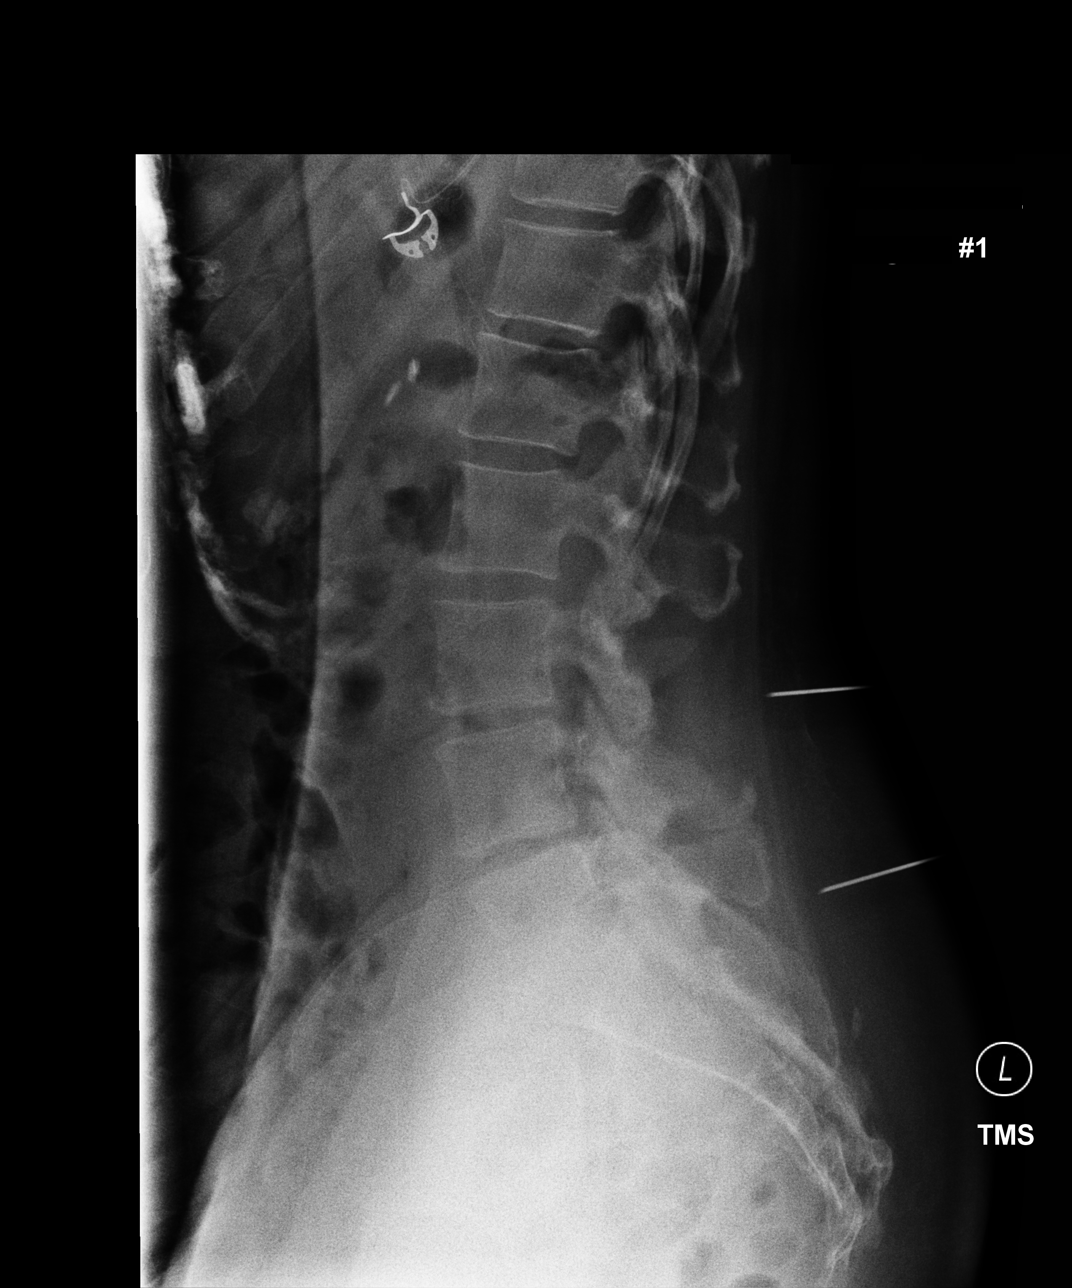

[lateral (2 of 3)]
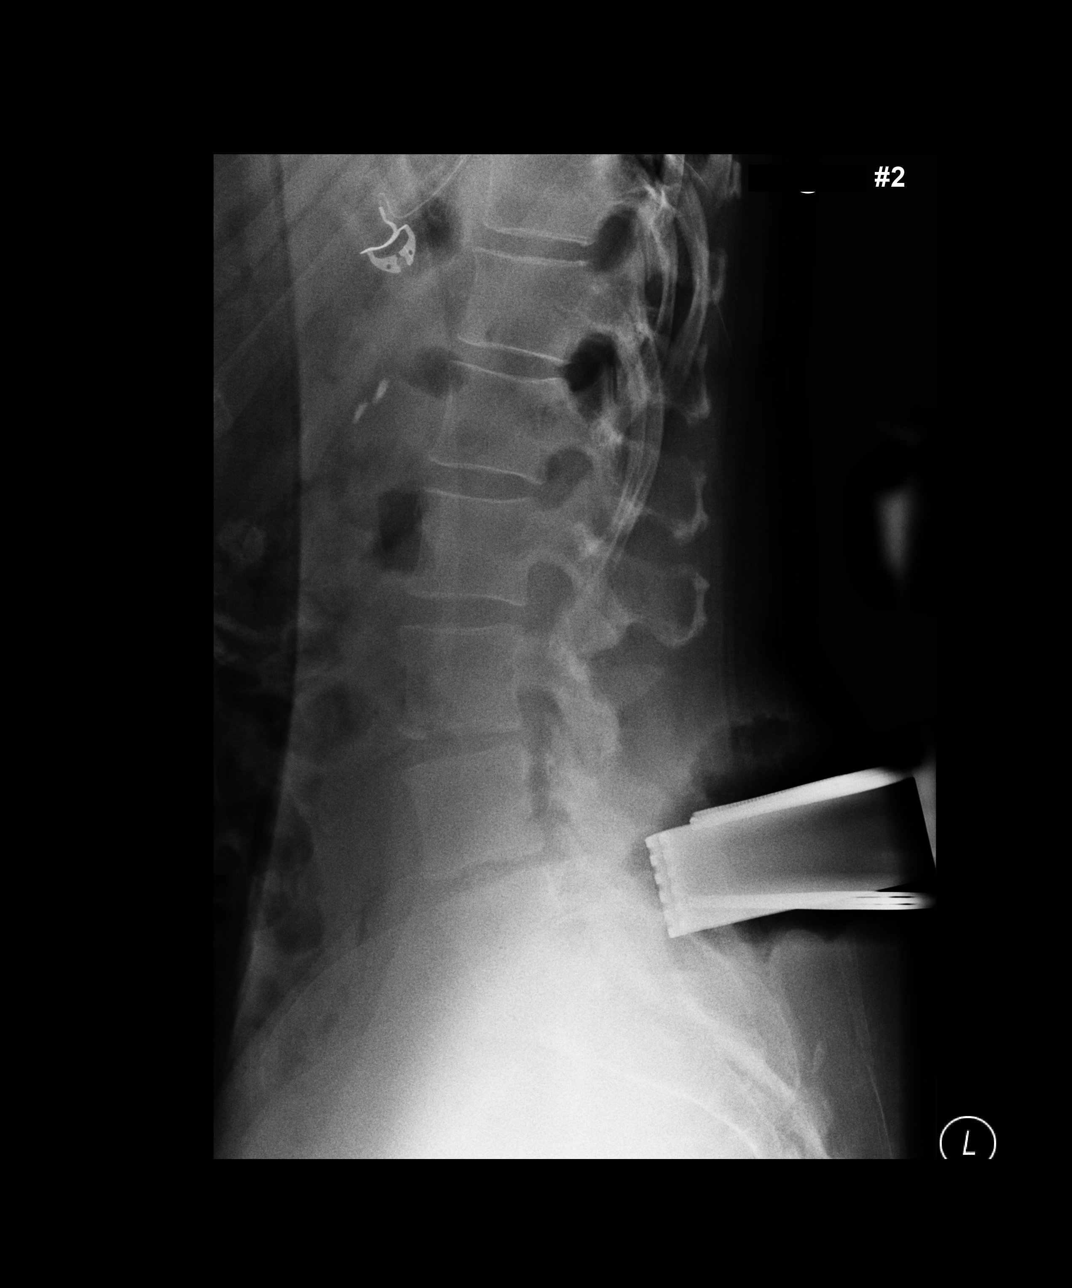

[lateral (3 of 3)]
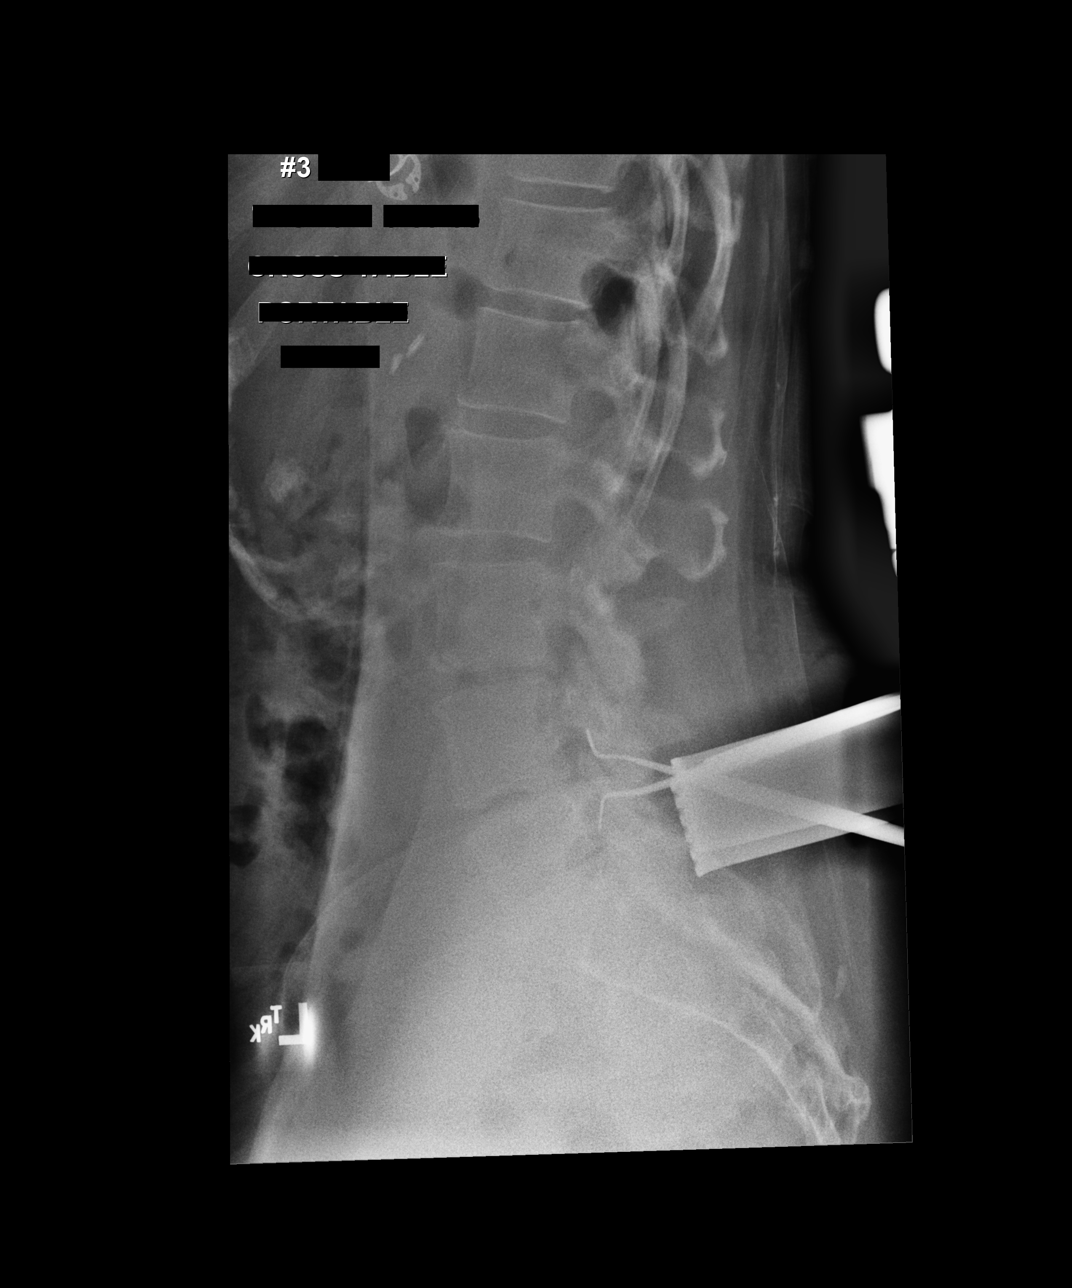

[3 of 3 positions shown; findings below may reference images not displayed]

FINDINGS: Three intraoperative cross-table lateral projections were obtained
of the lumbar spine. The first image demonstrates surgical probes at
the L3-4 and S1-2 levels. The second image demonstrates surgical
probe and retractors posterior to L5. The third image demonstrates
surgical probes posterior to the L4 and L5 vertebral bodies.
IMPRESSION: Surgical localization as described above.
